# Patient Record
Sex: Female | Born: 1970 | Race: White | Hispanic: No | Marital: Single | Smoking: Never smoker
Health system: Southern US, Community
[De-identification: ages and names within clinical notes are randomized; demographics above are authoritative.]

## PROBLEM LIST (undated history)

## (undated) ENCOUNTER — Emergency Department (HOSPITAL_BASED_OUTPATIENT_CLINIC_OR_DEPARTMENT_OTHER): Payer: Medicaid Other | Source: Home / Self Care

## (undated) DIAGNOSIS — F419 Anxiety disorder, unspecified: Secondary | ICD-10-CM

## (undated) DIAGNOSIS — F329 Major depressive disorder, single episode, unspecified: Secondary | ICD-10-CM

## (undated) DIAGNOSIS — E039 Hypothyroidism, unspecified: Secondary | ICD-10-CM

## (undated) DIAGNOSIS — F32A Depression, unspecified: Secondary | ICD-10-CM

## (undated) DIAGNOSIS — R51 Headache: Secondary | ICD-10-CM

## (undated) DIAGNOSIS — R519 Headache, unspecified: Secondary | ICD-10-CM

## (undated) HISTORY — PX: TUBAL LIGATION: SHX77

---

## 2017-09-30 ENCOUNTER — Emergency Department (HOSPITAL_COMMUNITY)
Admission: EM | Admit: 2017-09-30 | Discharge: 2017-10-01 | Disposition: A | Discharge status: Home / Self Care | Payer: Self-pay | Attending: Emergency Medicine | Admitting: Emergency Medicine

## 2017-09-30 ENCOUNTER — Emergency Department (HOSPITAL_COMMUNITY): Payer: Self-pay

## 2017-09-30 DIAGNOSIS — M5106 Intervertebral disc disorders with myelopathy, lumbar region: Secondary | ICD-10-CM | POA: Insufficient documentation

## 2017-09-30 MED ORDER — HYDROMORPHONE HCL 1 MG/ML IJ SOLN
1.0000 mg | Freq: Once | INTRAMUSCULAR | Status: AC
  Administered 2017-10-01: 1 mg via INTRAVENOUS
  Filled 2017-09-30: qty 1

## 2017-09-30 MED ORDER — DEXAMETHASONE SODIUM PHOSPHATE 10 MG/ML IJ SOLN
10.0000 mg | Freq: Once | INTRAMUSCULAR | Status: AC
  Administered 2017-09-30: 10 mg via INTRAVENOUS
  Filled 2017-09-30: qty 1

## 2017-09-30 MED ORDER — HYDROMORPHONE HCL 1 MG/ML IJ SOLN
1.0000 mg | Freq: Once | INTRAMUSCULAR | Status: AC
  Administered 2017-09-30: 1 mg via INTRAVENOUS
  Filled 2017-09-30: qty 1

## 2017-09-30 MED ORDER — DIAZEPAM 5 MG PO TABS
5.0000 mg | ORAL_TABLET | Freq: Once | ORAL | Status: AC
  Administered 2017-09-30: 5 mg via ORAL
  Filled 2017-09-30: qty 1

## 2017-09-30 NOTE — ED Triage Notes (Signed)
Pt to ER for evaluation of back pain, states onset yesterday, with leg numbness and urinary incontinence. 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 Reports onset yesterday while bending over.

## 2017-09-30 NOTE — ED Notes (Signed)
Pt in MRI.

## 2017-09-30 NOTE — ED Provider Notes (Signed)
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  She reports the pain has been constant and worsening since onset.  She reports that she developed numbness and worsening pain that radiates down the left leg yesterday.  Today she had an episode of urinary incontinence and loose stool that she said she had a hard time "keeping in" denies fevers and chills.  ROS: Urinary incontinence, back pain, left leg numbness and pain  Physical Exam:   Gen: No distress  Neuro: Awake and Alert  Skin: Warm    Focused Exam: Decreased sensation to sharp and light touch to the left lower extremity as compared to the right.  5 out of 5 strength against resistance with dorsiflexion plantarflexion.  Point tenderness to the spinous processes of the lumbar spine.  No bilateral paraspinal muscle tenderness.  No overlying erythema, edema, or warmth.   Initiation of care has begun. The patient has been counseled on the process, plan, and necessity for staying for the completion/evaluation, and the remainder of the medical screening examination    Barkley Boards, PA-C 09/30/17 Daisy Blossom, MD 10/15/17 1046

## 2017-10-01 MED ORDER — PERCOCET 5-325 MG PO TABS: 1.0000 | ORAL_TABLET | ORAL | 0 refills | Status: AC | PRN

## 2017-10-01 MED ORDER — PREDNISONE 50 MG PO TABS: 50.0000 mg | ORAL_TABLET | Freq: Every day | ORAL | 0 refills | Status: DC

## 2017-10-01 MED ORDER — CYCLOBENZAPRINE HCL 10 MG PO TABS: 10.0000 mg | ORAL_TABLET | Freq: Three times a day (TID) | ORAL | 0 refills | Status: DC | PRN

## 2017-10-01 NOTE — Discharge Instructions (Addendum)
Return here as needed.  Follow-up with the neurosurgeon provided. °

## 2017-10-02 ENCOUNTER — Encounter (HOSPITAL_COMMUNITY): Payer: Self-pay | Admitting: Emergency Medicine

## 2017-10-02 ENCOUNTER — Emergency Department (HOSPITAL_COMMUNITY)
Admission: EM | Admit: 2017-10-02 | Discharge: 2017-10-03 | Disposition: A | Discharge status: Home / Self Care | Payer: Self-pay | Attending: Emergency Medicine | Admitting: Emergency Medicine

## 2017-10-02 ENCOUNTER — Other Ambulatory Visit: Payer: Self-pay

## 2017-10-02 DIAGNOSIS — M5417 Radiculopathy, lumbosacral region: Secondary | ICD-10-CM | POA: Insufficient documentation

## 2017-10-02 DIAGNOSIS — Z79899 Other long term (current) drug therapy: Secondary | ICD-10-CM | POA: Insufficient documentation

## 2017-10-02 DIAGNOSIS — M5442 Lumbago with sciatica, left side: Secondary | ICD-10-CM | POA: Insufficient documentation

## 2017-10-02 HISTORY — DX: Major depressive disorder, single episode, unspecified: F32.9

## 2017-10-02 HISTORY — DX: Depression, unspecified: F32.A

## 2017-10-02 LAB — URINALYSIS, ROUTINE W REFLEX MICROSCOPIC
Bilirubin Urine: NEGATIVE
Glucose, UA: NEGATIVE mg/dL
Hgb urine dipstick: NEGATIVE
Ketones, ur: NEGATIVE mg/dL
Leukocytes, UA: NEGATIVE
Nitrite: NEGATIVE
Protein, ur: NEGATIVE mg/dL
Specific Gravity, Urine: 1.042 — ABNORMAL HIGH (ref 1.005–1.030)
pH: 5 (ref 5.0–8.0)

## 2017-10-02 NOTE — ED Notes (Signed)
Wait for treatment room explained. 

## 2017-10-02 NOTE — ED Triage Notes (Signed)
Pt presents with c/o "I cannot walk"; was here a couple days ago for back pain and had MRI done which showed nerves impinged at L5 and S1; pt states she was sent home with pain medication and muscle relaxers and neurosurgeon consult; today patient states she cannot walk, not because she simply cannot but d/t the pain; pt states pain medication not working; cannot care for her children at home and states the appt for neurosurgery is too far out, requesting a neurosurg consult while she is here.

## 2017-10-02 NOTE — ED Notes (Signed)
Updated patient on wait for treatment room.

## 2017-10-02 NOTE — ED Notes (Addendum)
Gave pt urine sample cup in waiting room

## 2017-10-02 NOTE — ED Notes (Signed)
Updated on wait for treatment room. 

## 2017-10-03 MED ORDER — ONDANSETRON HCL 4 MG/2ML IJ SOLN
4.0000 mg | Freq: Once | INTRAMUSCULAR | Status: AC
  Administered 2017-10-03: 4 mg via INTRAVENOUS
  Filled 2017-10-03: qty 2

## 2017-10-03 MED ORDER — HYDROMORPHONE HCL 1 MG/ML IJ SOLN
1.0000 mg | Freq: Once | INTRAMUSCULAR | Status: AC
  Administered 2017-10-03: 1 mg via INTRAVENOUS
  Filled 2017-10-03: qty 1

## 2017-10-03 MED ORDER — METHOCARBAMOL 1000 MG/10ML IJ SOLN
1000.0000 mg | Freq: Once | INTRAVENOUS | Status: AC
  Administered 2017-10-03: 1000 mg via INTRAVENOUS
  Filled 2017-10-03: qty 10

## 2017-10-03 MED ORDER — OXYCODONE HCL 10 MG PO TABS: 10.0000 mg | ORAL_TABLET | ORAL | 0 refills | Status: AC | PRN

## 2017-10-03 MED ORDER — METHOCARBAMOL 500 MG PO TABS: 1000.0000 mg | ORAL_TABLET | Freq: Three times a day (TID) | ORAL | 0 refills | Status: AC | PRN

## 2017-10-03 NOTE — Discharge Instructions (Addendum)
Take the prescription medications as directed. Rest the back as much as possible. Alternate cool and warm compresses for comfort. Follow up with neurosurgery as previously referred or with orthopedics (Dr. Everardo Pacific).

## 2017-10-03 NOTE — ED Provider Notes (Signed)
[facility] EMERGENCY DEPARTMENT Provider Note   CSN: [id number] Arrival date & time: 10/02/17  1744     History   Chief Complaint Chief Complaint  Patient presents with  . Difficulty Walking  . Back Pain    HPI Toni Thomas is a 47 y.o. female.  47 year old female presents with complaint of pain in her back.  Patient states that she developed sudden onset of left lower back pain 3 days ago with pain radiating down her left posterior thigh to her foot.  Patient was seen in the emergency room at that time and told she had a pinched nerve root at L5-S1.  Patient states she has known herniated disks in her lumbar spine at L2-L3 and L4-L5.  Patient has been taking the prednisone, Percocet, Flexeril without any relief of her pain, has tried increasing her Percocet as well is adding 1 Tylenol and 3 ibuprofen without any relief of her pain. Denies abdominal pain, loss of bowel control, groin numbness. Patient states she is able to ambulate however she has severe pain in doing so and cannot wait until her neurosurgery follow up appointment, unable to care for her child in her current condition. Denies history of IVDA, fever. No other complaints or concerns.      Past Medical History:  Diagnosis Date  . Depressed     There are no active problems to display for this patient.   Past Surgical History:  Procedure Laterality Date  . TUBAL LIGATION       OB History   None      Home Medications    Prior to Admission medications   Medication Sig Start Date End Date Taking? Authorizing Provider  acetaminophen (TYLENOL) 325 MG tablet Take 975 mg by mouth 2 (two) times daily.    [provider]  cyclobenzaprine (FLEXERIL) 10 MG tablet Take 1 tablet (10 mg total) by mouth 3 (three) times daily as needed for muscle spasms. 10/01/17   Lawyer, Cristal Deer, PA-C  ibuprofen (ADVIL,MOTRIN) 200 MG tablet Take 800 mg by mouth 2 (two) times daily.    [provider]  PERCOCET 5-325 MG tablet Take 1 tablet by mouth every 4 (four) hours as needed for severe pain. 10/01/17   Lawyer, Cristal Deer, PA-C  predniSONE (DELTASONE) 50 MG tablet Take 1 tablet (50 mg total) by mouth daily with breakfast. 10/01/17   Lawyer, Cristal Deer, PA-C  venlafaxine XR (EFFEXOR-XR) 150 MG 24 hr capsule Take 150 mg by mouth daily with breakfast. Take with 75 mg to equal 225 mg    [provider]  venlafaxine XR (EFFEXOR-XR) 75 MG 24 hr capsule Take 75 mg by mouth daily with breakfast. Take with 150 mg to equal 225 mg    [provider]    Family History History reviewed. No pertinent family history.  Social History Social History   Tobacco Use  . Smoking status: Never Smoker  Substance Use Topics  . Alcohol use: Not Currently  . Drug use: Not Currently     Allergies   Toradol [ketorolac tromethamine]   Review of Systems Review of Systems  Constitutional: Negative for fever.  Gastrointestinal: Negative for abdominal pain, constipation and diarrhea.  Genitourinary: Negative for dysuria and frequency.  Musculoskeletal: Positive for back pain and gait problem.  Skin: Negative for rash and wound.  Allergic/Immunologic: Negative for immunocompromised state.  Neurological: Positive for weakness. Negative for numbness.  All other systems reviewed and are negative.    Physical Exam Updated  Vital Signs BP (!) 149/89 (BP Location: Right Arm)   Pulse 70   Temp 97.9 F (36.6 C)   Resp 16   LMP 09/26/2017   SpO2 100%   Physical Exam  Constitutional: She is oriented to person, place, and time. She appears well-developed and well-nourished. No distress.  HENT:  Head: Normocephalic and atraumatic.  Cardiovascular: Intact distal pulses.  Pulmonary/Chest: Effort normal.  Abdominal: Soft. She exhibits no distension. There is no tenderness.  Musculoskeletal: She exhibits tenderness. She exhibits no deformity.       Lumbar back: She exhibits  tenderness. She exhibits no bony tenderness.       Back:  Neurological: She is alert and oriented to person, place, and time. No sensory deficit. She displays no Babinski's sign on the right side. She displays no Babinski's sign on the left side.  Reflex Scores:      Patellar reflexes are 2+ on the right side and 2+ on the left side. Symmetric dorsiflexion, plantar flexion weak on left compared to right. Reports normal sensation.   Skin: Skin is warm and dry. She is not diaphoretic.  Psychiatric: She has a normal mood and affect. Her behavior is normal.  Nursing note and vitals reviewed.    ED Treatments / Results  Labs (all labs ordered are listed, but only abnormal results are displayed) Labs Reviewed  URINALYSIS, ROUTINE W REFLEX MICROSCOPIC - Abnormal; Notable for the following components:      Result Value   Specific Gravity, Urine 1.042 (*)    All other components within normal limits    EKG None  Radiology No results found.  Procedures Procedures (including critical care time)  Medications Ordered in ED Medications  HYDROmorphone (DILAUDID) injection 1 mg (has no administration in time range)  ondansetron (ZOFRAN) injection 4 mg (has no administration in time range)  methocarbamol (ROBAXIN) 1,000 mg in dextrose 5 % 50 mL IVPB (has no administration in time range)     Initial Impression / Assessment and Plan / ED Course  I have reviewed the triage vital signs and the nursing notes.  Pertinent labs & imaging results that were available during my care of the patient were reviewed by me and considered in my medical decision making (see chart for details).  Clinical Course as of Oct 04 102  Sat Oct 03, 2017  0045 46yo female returns to the ER for ongoing low back pain radiating to the left leg. Patient was seen in the ER 09/30/17 for acute onset pain after bending over, MRI negative for cauda equina. Patient has been taking Prednisone, Percocet, Tylenol, Motrin,  Flexeril without any improvement in her pain. On exam, left SI tenderness, reflexes symmetric, slight weakness with plantar flexion left foot. Discussed with Dr. Preston Fleeting, Er attending, recommends Robaxin Iv, will also given Dilaudid and Zofran. Goal is to reduce pain to allow for dc and better control with PO pain medications at home. If patient has relief of pain with robaxin, will give rx for same and dc Flexeril.    [LM]    Clinical Course User Index [LM] Jeannie Fend, PA-C    Final Clinical Impressions(s) / ED Diagnoses   Final diagnoses:  Lumbosacral radiculopathy    ED Discharge Orders    None       Jeannie Fend, PA-C 10/03/17 0105    Dione Booze, MD 10/03/17 [phone]

## 2017-10-03 NOTE — ED Provider Notes (Signed)
4 days ago here with back pain after bending over MRI showed 'pinched nerve" L5-S1 Here with uncontrolled pain  Requesting neurosurg tonight - explained there won't be consult in ED tonight Pending IV Robaxin, Dilaudid and Zofran  Plan: discharge home to keep scheduled outpatient appointment with neurosurg  Recheck - patient is tearful. States pain is not appreciably better. Discussed additional dose of dilaudid here and changing home regimen. Will also provide ortho referral to expedite being seen for further outpatient treatment.    Elpidio Anis, PA-C 10/03/17 0256    Dione Booze, MD 10/03/17 [phone]

## 2017-10-05 ENCOUNTER — Other Ambulatory Visit: Payer: Self-pay | Admitting: Neurosurgery

## 2017-10-08 ENCOUNTER — Other Ambulatory Visit: Payer: Self-pay

## 2017-10-08 ENCOUNTER — Encounter (HOSPITAL_COMMUNITY): Payer: Self-pay | Admitting: *Deleted

## 2017-10-08 NOTE — Progress Notes (Signed)
Spoke with pt for pre-op call. Pt denies cardiac history, HTN or diabetes. Pt states she does have a productive cough of thick, yellow sputum. I could hear her cough and it sounded very congested. I strongly encouraged her to see her PCP or go to an Urgent Care to be checked out. She denies any fever or any other symptoms. States she's had the cough for about 4 days.

## 2017-10-09 ENCOUNTER — Other Ambulatory Visit: Payer: Self-pay

## 2017-10-09 ENCOUNTER — Encounter (HOSPITAL_COMMUNITY)
Admission: RE | Disposition: A | Discharge status: Home / Self Care | Payer: Self-pay | Source: Ambulatory Visit | Attending: Neurosurgery

## 2017-10-09 ENCOUNTER — Ambulatory Visit (HOSPITAL_COMMUNITY): Payer: Self-pay

## 2017-10-09 ENCOUNTER — Ambulatory Visit (HOSPITAL_COMMUNITY): Payer: Self-pay | Admitting: Anesthesiology

## 2017-10-09 ENCOUNTER — Encounter (HOSPITAL_COMMUNITY): Payer: Self-pay | Admitting: Certified Registered Nurse Anesthetist

## 2017-10-09 ENCOUNTER — Ambulatory Visit (HOSPITAL_COMMUNITY)
Admission: RE | Admit: 2017-10-09 | Discharge: 2017-10-10 | Disposition: A | Discharge status: Home / Self Care | Payer: Self-pay | Source: Ambulatory Visit | Attending: Neurosurgery | Admitting: Neurosurgery

## 2017-10-09 DIAGNOSIS — Z419 Encounter for procedure for purposes other than remedying health state, unspecified: Secondary | ICD-10-CM

## 2017-10-09 DIAGNOSIS — Z6837 Body mass index (BMI) 37.0-37.9, adult: Secondary | ICD-10-CM | POA: Insufficient documentation

## 2017-10-09 DIAGNOSIS — M5126 Other intervertebral disc displacement, lumbar region: Secondary | ICD-10-CM | POA: Insufficient documentation

## 2017-10-09 DIAGNOSIS — E039 Hypothyroidism, unspecified: Secondary | ICD-10-CM | POA: Insufficient documentation

## 2017-10-09 DIAGNOSIS — E669 Obesity, unspecified: Secondary | ICD-10-CM | POA: Insufficient documentation

## 2017-10-09 HISTORY — DX: Anxiety disorder, unspecified: F41.9

## 2017-10-09 HISTORY — DX: Hypothyroidism, unspecified: E03.9

## 2017-10-09 HISTORY — DX: Headache: R51

## 2017-10-09 HISTORY — DX: Headache, unspecified: R51.9

## 2017-10-09 HISTORY — PX: LUMBAR LAMINECTOMY/DECOMPRESSION MICRODISCECTOMY: SHX5026

## 2017-10-09 LAB — CBC
HEMATOCRIT: 40.7 % (ref 36.0–46.0)
Hemoglobin: 12.5 g/dL (ref 12.0–15.0)
MCH: 26.8 pg (ref 26.0–34.0)
MCHC: 30.7 g/dL (ref 30.0–36.0)
MCV: 87.3 fL (ref 78.0–100.0)
PLATELETS: 336 10*3/uL (ref 150–400)
RBC: 4.66 MIL/uL (ref 3.87–5.11)
RDW: 16.6 % — ABNORMAL HIGH (ref 11.5–15.5)
WBC: 7.3 10*3/uL (ref 4.0–10.5)

## 2017-10-09 SURGERY — LUMBAR LAMINECTOMY/DECOMPRESSION MICRODISCECTOMY 1 LEVEL
Anesthesia: General | Laterality: Left

## 2017-10-09 MED ORDER — LIDOCAINE 2% (20 MG/ML) 5 ML SYRINGE
INTRAMUSCULAR | Status: DC | PRN
  Administered 2017-10-09: 40 mg via INTRAVENOUS

## 2017-10-09 MED ORDER — PROPOFOL 10 MG/ML IV BOLUS
INTRAVENOUS | Status: AC
  Filled 2017-10-09: qty 20

## 2017-10-09 MED ORDER — LIDOCAINE-EPINEPHRINE 0.5 %-1:200000 IJ SOLN
INTRAMUSCULAR | Status: DC | PRN
  Administered 2017-10-09: 10 mL

## 2017-10-09 MED ORDER — VENLAFAXINE HCL ER 75 MG PO CP24
225.0000 mg | ORAL_CAPSULE | Freq: Every day | ORAL | Status: DC
  Administered 2017-10-10: 225 mg via ORAL
  Filled 2017-10-09: qty 3

## 2017-10-09 MED ORDER — CELECOXIB 200 MG PO CAPS
200.0000 mg | ORAL_CAPSULE | Freq: Two times a day (BID) | ORAL | Status: DC
  Administered 2017-10-09 – 2017-10-10 (×2): 200 mg via ORAL
  Filled 2017-10-09 (×2): qty 1

## 2017-10-09 MED ORDER — OXYCODONE HCL 5 MG PO TABS
ORAL_TABLET | ORAL | Status: AC
  Filled 2017-10-09: qty 2

## 2017-10-09 MED ORDER — OXYCODONE HCL 5 MG PO TABS
10.0000 mg | ORAL_TABLET | ORAL | Status: DC | PRN
  Administered 2017-10-09 – 2017-10-10 (×3): 10 mg via ORAL
  Filled 2017-10-09 (×2): qty 2

## 2017-10-09 MED ORDER — OXYCODONE HCL 5 MG PO TABS
5.0000 mg | ORAL_TABLET | ORAL | Status: DC | PRN
  Administered 2017-10-09: 5 mg via ORAL
  Filled 2017-10-09: qty 1

## 2017-10-09 MED ORDER — VENLAFAXINE HCL ER 75 MG PO CP24: 150.0000 mg | ORAL_CAPSULE | Freq: Every day | ORAL | Status: DC

## 2017-10-09 MED ORDER — SODIUM CHLORIDE 0.9% FLUSH
3.0000 mL | Freq: Two times a day (BID) | INTRAVENOUS | Status: DC
  Administered 2017-10-09: 3 mL via INTRAVENOUS

## 2017-10-09 MED ORDER — ROCURONIUM BROMIDE 50 MG/5ML IV SOSY
PREFILLED_SYRINGE | INTRAVENOUS | Status: AC
  Filled 2017-10-09: qty 10

## 2017-10-09 MED ORDER — GABAPENTIN 300 MG PO CAPS
300.0000 mg | ORAL_CAPSULE | Freq: Three times a day (TID) | ORAL | Status: DC
  Administered 2017-10-09 – 2017-10-10 (×2): 300 mg via ORAL
  Filled 2017-10-09 (×2): qty 1

## 2017-10-09 MED ORDER — DEXAMETHASONE SODIUM PHOSPHATE 10 MG/ML IJ SOLN
INTRAMUSCULAR | Status: DC | PRN
  Administered 2017-10-09: 10 mg via INTRAVENOUS

## 2017-10-09 MED ORDER — THROMBIN 5000 UNITS EX SOLR
CUTANEOUS | Status: AC
  Filled 2017-10-09: qty 10000

## 2017-10-09 MED ORDER — ONDANSETRON HCL 4 MG PO TABS: 4.0000 mg | ORAL_TABLET | Freq: Four times a day (QID) | ORAL | Status: DC | PRN

## 2017-10-09 MED ORDER — ACETAMINOPHEN 325 MG PO TABS: 650.0000 mg | ORAL_TABLET | ORAL | Status: DC | PRN

## 2017-10-09 MED ORDER — LACTATED RINGERS IV SOLN
INTRAVENOUS | Status: DC
  Administered 2017-10-09 (×2): via INTRAVENOUS

## 2017-10-09 MED ORDER — OXYCODONE HCL ER 10 MG PO T12A
10.0000 mg | EXTENDED_RELEASE_TABLET | Freq: Two times a day (BID) | ORAL | Status: DC
  Administered 2017-10-09 – 2017-10-10 (×2): 10 mg via ORAL
  Filled 2017-10-09 (×2): qty 1

## 2017-10-09 MED ORDER — PHENYLEPHRINE 40 MCG/ML (10ML) SYRINGE FOR IV PUSH (FOR BLOOD PRESSURE SUPPORT)
PREFILLED_SYRINGE | INTRAVENOUS | Status: AC
  Filled 2017-10-09: qty 20

## 2017-10-09 MED ORDER — CHLORHEXIDINE GLUCONATE CLOTH 2 % EX PADS: 6.0000 | MEDICATED_PAD | Freq: Once | CUTANEOUS | Status: DC

## 2017-10-09 MED ORDER — HYDROMORPHONE HCL 1 MG/ML IJ SOLN: 1.0000 mg | INTRAMUSCULAR | Status: DC | PRN

## 2017-10-09 MED ORDER — PROMETHAZINE HCL 25 MG/ML IJ SOLN: 6.2500 mg | INTRAMUSCULAR | Status: DC | PRN

## 2017-10-09 MED ORDER — ACETAMINOPHEN 500 MG PO TABS
1000.0000 mg | ORAL_TABLET | Freq: Four times a day (QID) | ORAL | Status: DC
  Administered 2017-10-09 – 2017-10-10 (×3): 1000 mg via ORAL
  Filled 2017-10-09 (×3): qty 2

## 2017-10-09 MED ORDER — POTASSIUM CHLORIDE IN NACL 20-0.9 MEQ/L-% IV SOLN: INTRAVENOUS | Status: DC

## 2017-10-09 MED ORDER — FENTANYL CITRATE (PF) 100 MCG/2ML IJ SOLN
INTRAMUSCULAR | Status: AC
  Filled 2017-10-09: qty 2

## 2017-10-09 MED ORDER — ONDANSETRON HCL 4 MG/2ML IJ SOLN: 4.0000 mg | Freq: Four times a day (QID) | INTRAMUSCULAR | Status: DC | PRN

## 2017-10-09 MED ORDER — DIAZEPAM 5 MG PO TABS
5.0000 mg | ORAL_TABLET | Freq: Four times a day (QID) | ORAL | Status: DC | PRN
  Administered 2017-10-09: 5 mg via ORAL
  Filled 2017-10-09: qty 1

## 2017-10-09 MED ORDER — OXYCODONE HCL 5 MG/5ML PO SOLN: 5.0000 mg | Freq: Once | ORAL | Status: DC | PRN

## 2017-10-09 MED ORDER — DEXAMETHASONE SODIUM PHOSPHATE 10 MG/ML IJ SOLN
INTRAMUSCULAR | Status: AC
  Filled 2017-10-09: qty 2

## 2017-10-09 MED ORDER — CEFAZOLIN SODIUM-DEXTROSE 2-4 GM/100ML-% IV SOLN
INTRAVENOUS | Status: AC
  Filled 2017-10-09: qty 100

## 2017-10-09 MED ORDER — CEFAZOLIN SODIUM-DEXTROSE 2-4 GM/100ML-% IV SOLN
2.0000 g | INTRAVENOUS | Status: AC
  Administered 2017-10-09: 2 g via INTRAVENOUS

## 2017-10-09 MED ORDER — OXYCODONE HCL 5 MG PO TABS: 5.0000 mg | ORAL_TABLET | Freq: Once | ORAL | Status: DC | PRN

## 2017-10-09 MED ORDER — THROMBIN 5000 UNITS EX SOLR
CUTANEOUS | Status: DC | PRN
  Administered 2017-10-09: 10000 [IU] via TOPICAL

## 2017-10-09 MED ORDER — FENTANYL CITRATE (PF) 100 MCG/2ML IJ SOLN
25.0000 ug | INTRAMUSCULAR | Status: DC | PRN
  Administered 2017-10-09: 25 ug via INTRAVENOUS
  Administered 2017-10-09: 50 ug via INTRAVENOUS

## 2017-10-09 MED ORDER — VENLAFAXINE HCL ER 75 MG PO CP24: 75.0000 mg | ORAL_CAPSULE | Freq: Every day | ORAL | Status: DC

## 2017-10-09 MED ORDER — ROCURONIUM BROMIDE 50 MG/5ML IV SOSY
PREFILLED_SYRINGE | INTRAVENOUS | Status: AC
  Filled 2017-10-09: qty 5

## 2017-10-09 MED ORDER — ONDANSETRON HCL 4 MG/2ML IJ SOLN
INTRAMUSCULAR | Status: AC
  Filled 2017-10-09: qty 6

## 2017-10-09 MED ORDER — FENTANYL CITRATE (PF) 100 MCG/2ML IJ SOLN
50.0000 ug | Freq: Once | INTRAMUSCULAR | Status: AC
  Administered 2017-10-09: 50 ug via INTRAVENOUS

## 2017-10-09 MED ORDER — ACETAMINOPHEN 650 MG RE SUPP: 650.0000 mg | RECTAL | Status: DC | PRN

## 2017-10-09 MED ORDER — DOCUSATE SODIUM 100 MG PO CAPS
100.0000 mg | ORAL_CAPSULE | Freq: Two times a day (BID) | ORAL | Status: DC
  Administered 2017-10-09 – 2017-10-10 (×2): 100 mg via ORAL
  Filled 2017-10-09 (×2): qty 1

## 2017-10-09 MED ORDER — ONDANSETRON HCL 4 MG/2ML IJ SOLN
INTRAMUSCULAR | Status: AC
  Filled 2017-10-09: qty 2

## 2017-10-09 MED ORDER — ROCURONIUM BROMIDE 10 MG/ML (PF) SYRINGE
PREFILLED_SYRINGE | INTRAVENOUS | Status: DC | PRN
  Administered 2017-10-09: 20 mg via INTRAVENOUS
  Administered 2017-10-09: 50 mg via INTRAVENOUS

## 2017-10-09 MED ORDER — FENTANYL CITRATE (PF) 100 MCG/2ML IJ SOLN
INTRAMUSCULAR | Status: DC | PRN
  Administered 2017-10-09 (×3): 50 ug via INTRAVENOUS

## 2017-10-09 MED ORDER — HEMOSTATIC AGENTS (NO CHARGE) OPTIME
TOPICAL | Status: DC | PRN
  Administered 2017-10-09: 1 via TOPICAL

## 2017-10-09 MED ORDER — PHENOL 1.4 % MT LIQD: 1.0000 | OROMUCOSAL | Status: DC | PRN

## 2017-10-09 MED ORDER — SODIUM CHLORIDE 0.9 % IV SOLN
INTRAVENOUS | Status: DC | PRN
  Administered 2017-10-09: 25 ug/min via INTRAVENOUS

## 2017-10-09 MED ORDER — LIDOCAINE-EPINEPHRINE 0.5 %-1:200000 IJ SOLN
INTRAMUSCULAR | Status: AC
  Filled 2017-10-09: qty 1

## 2017-10-09 MED ORDER — SODIUM CHLORIDE 0.9% FLUSH: 3.0000 mL | INTRAVENOUS | Status: DC | PRN

## 2017-10-09 MED ORDER — SUGAMMADEX SODIUM 200 MG/2ML IV SOLN
INTRAVENOUS | Status: DC | PRN
  Administered 2017-10-09: 200 mg via INTRAVENOUS

## 2017-10-09 MED ORDER — LIDOCAINE 2% (20 MG/ML) 5 ML SYRINGE
INTRAMUSCULAR | Status: AC
  Filled 2017-10-09: qty 5

## 2017-10-09 MED ORDER — DEXAMETHASONE SODIUM PHOSPHATE 10 MG/ML IJ SOLN
INTRAMUSCULAR | Status: AC
  Filled 2017-10-09: qty 1

## 2017-10-09 MED ORDER — MIDAZOLAM HCL 2 MG/2ML IJ SOLN
INTRAMUSCULAR | Status: DC | PRN
  Administered 2017-10-09: 2 mg via INTRAVENOUS

## 2017-10-09 MED ORDER — ZOLPIDEM TARTRATE 5 MG PO TABS: 5.0000 mg | ORAL_TABLET | Freq: Every evening | ORAL | Status: DC | PRN

## 2017-10-09 MED ORDER — ARTIFICIAL TEARS OPHTHALMIC OINT
TOPICAL_OINTMENT | OPHTHALMIC | Status: AC
  Filled 2017-10-09: qty 7

## 2017-10-09 MED ORDER — PHENYLEPHRINE 40 MCG/ML (10ML) SYRINGE FOR IV PUSH (FOR BLOOD PRESSURE SUPPORT)
PREFILLED_SYRINGE | INTRAVENOUS | Status: DC | PRN
  Administered 2017-10-09: 120 ug via INTRAVENOUS

## 2017-10-09 MED ORDER — 0.9 % SODIUM CHLORIDE (POUR BTL) OPTIME
TOPICAL | Status: DC | PRN
  Administered 2017-10-09: 1000 mL

## 2017-10-09 MED ORDER — MENTHOL 3 MG MT LOZG: 1.0000 | LOZENGE | OROMUCOSAL | Status: DC | PRN

## 2017-10-09 MED ORDER — ONDANSETRON HCL 4 MG/2ML IJ SOLN
INTRAMUSCULAR | Status: DC | PRN
  Administered 2017-10-09: 4 mg via INTRAVENOUS

## 2017-10-09 MED ORDER — PROPOFOL 10 MG/ML IV BOLUS
INTRAVENOUS | Status: DC | PRN
  Administered 2017-10-09: 200 mg via INTRAVENOUS

## 2017-10-09 MED ORDER — PROPOFOL 1000 MG/100ML IV EMUL
INTRAVENOUS | Status: AC
  Filled 2017-10-09: qty 100

## 2017-10-09 MED ORDER — SODIUM CHLORIDE 0.9 % IV SOLN: 250.0000 mL | INTRAVENOUS | Status: DC

## 2017-10-09 SURGICAL SUPPLY — 46 items
BENZOIN TINCTURE PRP APPL 2/3 (GAUZE/BANDAGES/DRESSINGS) IMPLANT
BLADE CLIPPER SURG (BLADE) IMPLANT
BUR MATCHSTICK NEURO 3.0 LAGG (BURR) ×2 IMPLANT
BUR PRECISION FLUTE 5.0 (BURR) ×2 IMPLANT
CANISTER SUCT 3000ML PPV (MISCELLANEOUS) ×2 IMPLANT
CARTRIDGE OIL MAESTRO DRILL (MISCELLANEOUS) ×1 IMPLANT
DECANTER SPIKE VIAL GLASS SM (MISCELLANEOUS) ×2 IMPLANT
DERMABOND ADVANCED (GAUZE/BANDAGES/DRESSINGS) ×1
DERMABOND ADVANCED .7 DNX12 (GAUZE/BANDAGES/DRESSINGS) ×1 IMPLANT
DIFFUSER DRILL AIR PNEUMATIC (MISCELLANEOUS) ×2 IMPLANT
DRAPE LAPAROTOMY 100X72X124 (DRAPES) ×2 IMPLANT
DRAPE MICROSCOPE LEICA (MISCELLANEOUS) ×2 IMPLANT
DRAPE SURG 17X23 STRL (DRAPES) ×2 IMPLANT
DURAPREP 26ML APPLICATOR (WOUND CARE) ×2 IMPLANT
ELECT REM PT RETURN 9FT ADLT (ELECTROSURGICAL) ×2
ELECTRODE REM PT RTRN 9FT ADLT (ELECTROSURGICAL) ×1 IMPLANT
GAUZE 4X4 16PLY RFD (DISPOSABLE) IMPLANT
GAUZE SPONGE 4X4 12PLY STRL (GAUZE/BANDAGES/DRESSINGS) IMPLANT
GLOVE ECLIPSE 6.5 STRL STRAW (GLOVE) ×2 IMPLANT
GLOVE EXAM NITRILE LRG STRL (GLOVE) IMPLANT
GLOVE EXAM NITRILE XL STR (GLOVE) IMPLANT
GLOVE EXAM NITRILE XS STR PU (GLOVE) IMPLANT
GOWN STRL REUS W/ TWL LRG LVL3 (GOWN DISPOSABLE) ×2 IMPLANT
GOWN STRL REUS W/ TWL XL LVL3 (GOWN DISPOSABLE) IMPLANT
GOWN STRL REUS W/TWL 2XL LVL3 (GOWN DISPOSABLE) IMPLANT
GOWN STRL REUS W/TWL LRG LVL3 (GOWN DISPOSABLE) ×2
GOWN STRL REUS W/TWL XL LVL3 (GOWN DISPOSABLE)
KIT BASIN OR (CUSTOM PROCEDURE TRAY) ×2 IMPLANT
KIT TURNOVER KIT B (KITS) ×2 IMPLANT
NEEDLE HYPO 25X1 1.5 SAFETY (NEEDLE) ×2 IMPLANT
NEEDLE SPNL 18GX3.5 QUINCKE PK (NEEDLE) ×2 IMPLANT
NS IRRIG 1000ML POUR BTL (IV SOLUTION) ×2 IMPLANT
OIL CARTRIDGE MAESTRO DRILL (MISCELLANEOUS) ×2
PACK LAMINECTOMY NEURO (CUSTOM PROCEDURE TRAY) ×2 IMPLANT
PAD ARMBOARD 7.5X6 YLW CONV (MISCELLANEOUS) ×6 IMPLANT
RUBBERBAND STERILE (MISCELLANEOUS) ×4 IMPLANT
SPONGE LAP 4X18 RFD (DISPOSABLE) IMPLANT
SPONGE SURGIFOAM ABS GEL SZ50 (HEMOSTASIS) ×2 IMPLANT
STRIP CLOSURE SKIN 1/2X4 (GAUZE/BANDAGES/DRESSINGS) IMPLANT
SUT VIC AB 0 CT1 18XCR BRD8 (SUTURE) ×1 IMPLANT
SUT VIC AB 0 CT1 8-18 (SUTURE) ×1
SUT VIC AB 2-0 CT1 18 (SUTURE) ×2 IMPLANT
SUT VIC AB 3-0 SH 8-18 (SUTURE) ×4 IMPLANT
TOWEL GREEN STERILE (TOWEL DISPOSABLE) ×2 IMPLANT
TOWEL GREEN STERILE FF (TOWEL DISPOSABLE) ×2 IMPLANT
WATER STERILE IRR 1000ML POUR (IV SOLUTION) ×2 IMPLANT

## 2017-10-09 NOTE — Anesthesia Procedure Notes (Signed)
Procedure Name: Intubation Date/Time: 10/09/2017 3:42 PM Performed by: Barrington Ellison, CRNA Pre-anesthesia Checklist: Patient identified, Emergency Drugs available, Suction available and Patient being monitored Patient Re-evaluated:Patient Re-evaluated prior to induction Oxygen Delivery Method: Circle System Utilized Preoxygenation: Pre-oxygenation with 100% oxygen Induction Type: IV induction Ventilation: Mask ventilation without difficulty Laryngoscope Size: Mac and 3 Grade View: Grade I Tube type: Oral Tube size: 7.0 mm Number of attempts: 1 Airway Equipment and Method: Stylet and Oral airway Placement Confirmation: ETT inserted through vocal cords under direct vision,  positive ETCO2 and breath sounds checked- equal and bilateral Secured at: 21 cm Tube secured with: Tape Dental Injury: Teeth and Oropharynx as per pre-operative assessment

## 2017-10-09 NOTE — H&P (Signed)
BP 118/71   Pulse 98   Temp 98.7 F (37.1 C) (Oral)   Resp 20   Ht 5\' 8"  (1.727 m)   Wt 111.1 kg   LMP 09/21/2017   SpO2 100%   BMI 37.25 kg/m  Toni Thomas presents with severe pain in the left lower extremity. MRI shows a large disc herniation at L4/5 eccentric to the left.  Allergies  Allergen Reactions  . Toradol [Ketorolac Tromethamine] Shortness Of Breath and Nausea And Vomiting    Chest Pain   Past Medical History:  Diagnosis Date  . Anxiety   . Depressed   . Headache   . Hypothyroidism    not on medication   Past Surgical History:  Procedure Laterality Date  . TUBAL LIGATION     Family History  Problem Relation Age of Onset  . Atrial fibrillation Mother    Social History   Socioeconomic History  . Marital status: Single    Spouse name: Not on file  . Number of children: Not on file  . Years of education: Not on file  . Highest education level: Not on file  Occupational History  . Not on file  Social Needs  . Financial resource strain: Not on file  . Food insecurity:    Worry: Not on file    Inability: Not on file  . Transportation needs:    Medical: Not on file    Non-medical: Not on file  Tobacco Use  . Smoking status: Never Smoker  . Smokeless tobacco: Never Used  Substance and Sexual Activity  . Alcohol use: Not Currently  . Drug use: Not Currently  . Sexual activity: Not on file  Lifestyle  . Physical activity:    Days per week: Not on file    Minutes per session: Not on file  . Stress: Not on file  Relationships  . Social connections:    Talks on phone: Not on file    Gets together: Not on file    Attends religious service: Not on file    Active member of club or organization: Not on file    Attends meetings of clubs or organizations: Not on file    Relationship status: Not on file  . Intimate partner violence:    Fear of current or ex partner: Not on file    Emotionally abused: Not on file    Physically abused: Not on file   Forced sexual activity: Not on file  Other Topics Concern  . Not on file  Social History Narrative  . Not on file   Physical Exam  Constitutional: She is oriented to person, place, and time. She appears well-developed and well-nourished. She appears distressed.  HENT:  Head: Normocephalic and atraumatic.  Eyes: Pupils are equal, round, and reactive to light. Conjunctivae and EOM are normal.  Neck: Normal range of motion. Neck supple.  Cardiovascular: Normal rate and regular rhythm.  Pulmonary/Chest: Effort normal and breath sounds normal.  Musculoskeletal: Normal range of motion.  Neurological: She is alert and oriented to person, place, and time. No cranial nerve deficit or sensory deficit. She exhibits normal muscle tone. Coordination normal.  Skin: Skin is warm and dry.  Psychiatric: She has a normal mood and affect. Her behavior is normal. Judgment and thought content normal.  BP 118/71   Pulse 98   Temp 98.7 F (37.1 C) (Oral)   Resp 20   Ht 5\' 8"  (1.727 m)   Wt 111.1 kg   LMP 09/21/2017  SpO2 100%   BMI 37.25 kg/m  Toni Thomas has decided to undergo a lumbar discetomy/decompression for a herniated disc at L4/5. Risks and benefits including but not limited to bleeding, infection, paralysis, weakness in one or both extremities, bowel and/or bladder dysfunction, need for further surgery, no relief of pain. [facility] understands and wishes to proceed.

## 2017-10-09 NOTE — Anesthesia Preprocedure Evaluation (Addendum)
Anesthesia Evaluation  Patient identified by MRN, date of birth, ID band Patient awake    Reviewed: Allergy & Precautions, NPO status , Patient's Chart, lab work & pertinent test results  History of Anesthesia Complications Negative for: history of anesthetic complications  Airway Mallampati: II  TM Distance: >3 FB Neck ROM: Full    Dental  (+) Dental Advisory Given, Poor Dentition, Loose,    Pulmonary neg pulmonary ROS,    breath sounds clear to auscultation       Cardiovascular negative cardio ROS   Rhythm:Regular Rate:Normal     Neuro/Psych  Headaches, PSYCHIATRIC DISORDERS Anxiety Depression  Herniated lumbar disc   Neuromuscular disease (left sided lumbar radicular pain)    GI/Hepatic negative GI ROS, Neg liver ROS,   Endo/Other  Hypothyroidism (no meds)  Obesity   Renal/GU negative Renal ROS  negative genitourinary   Musculoskeletal negative musculoskeletal ROS (+)   Abdominal (+) + obese,   Peds  Hematology negative hematology ROS (+)   Anesthesia Other Findings   Reproductive/Obstetrics  S/p tubal ligation                            Anesthesia Physical Anesthesia Plan  ASA: II  Anesthesia Plan: General   Post-op Pain Management:    Induction: Intravenous  PONV Risk Score and Plan: 4 or greater and Treatment may vary due to age or medical condition, Ondansetron, Midazolam, Dexamethasone and Scopolamine patch - Pre-op  Airway Management Planned: Oral ETT  Additional Equipment: None  Intra-op Plan:   Post-operative Plan: Extubation in OR  Informed Consent: I have reviewed the patients History and Physical, chart, labs and discussed the procedure including the risks, benefits and alternatives for the proposed anesthesia with the patient or authorized representative who has indicated his/her understanding and acceptance.   Dental advisory given  Plan Discussed  with: CRNA and Anesthesiologist  Anesthesia Plan Comments:        Anesthesia Quick Evaluation

## 2017-10-09 NOTE — Op Note (Signed)
10/09/2017  5:25 PM  PATIENT:  Toni Thomas  47 y.o. female with a herniated disc eccentric to the left at L4/5  PRE-OPERATIVE DIAGNOSIS:  Lumbar herniated disc L4/5 left POST-OPERATIVE DIAGNOSIS:  Lumbar herniated disc L4/5 Left PROCEDURE:  Procedure(s): Left Lumbar Four-Five Microdiscectomy  SURGEON:   Surgeon(s): Coletta Memosabbell, Darric Plante, MD  ASSISTANTS:none  ANESTHESIA:   general  EBL:  Total I/O In: 1000 [I.V.:1000] Out: 20 [Blood:20]  BLOOD ADMINISTERED:none    COUNT:per nursing  DRAINS: none   SPECIMEN:  No Specimen  DICTATION: [name] was taken to the operating room, intubated and placed under a general anesthetic without difficulty. She was positioned prone on a Wilson frame with all pressure points padded. Her back was prepped and draped in a sterile manner. I opened the skin with a 10 blade and carried the dissection down to the thoracolumbar fascia. I used both sharp dissection and the monopolar cautery to expose the lamina of L4, and 5. I confirmed my location with an intraoperative xray.  I used the drill, Kerrison punches, and curettes to perform a semihemilaminectomy of L4 and 5. I used the punches to remove the ligamentum flavum to expose the thecal sac. I brought the microscope into the operative field and started the decompression of the spinal canal, thecal sac and L5 root(s). I cauterized epidural veins overlying the disc space then divided them sharply. I opened the disc space with a 15 blade and proceeded with the discectomy. I used pituitary rongeurs, curettes, and other instruments to remove disc material. After the discectomy was completed I inspected the L5 nerve root and felt it was well decompressed. I explored rostrally, laterally, medially, and caudally and was satisfied with the decompression. I irrigated the wound, then closed in layers. I approximated the thoracolumbar fascia, subcutaneous, and subcuticular planes with vicryl sutures. I used  dermabond for a sterile dressing.   PLAN OF CARE: Admit for overnight observation  PATIENT DISPOSITION:  PACU - hemodynamically stable.   Delay start of Pharmacological VTE agent (>24hrs) due to surgical blood loss or risk of bleeding:  yes

## 2017-10-09 NOTE — Transfer of Care (Signed)
Immediate Anesthesia Transfer of Care Note  Patient: Toni Thomas  Procedure(s) Performed: Left Lumbar Four-Five Microdiscectomy (Left )  Patient Location: PACU  Anesthesia Type:General  Level of Consciousness: awake, alert  and oriented  Airway & Oxygen Therapy: Patient Spontanous Breathing  Post-op Assessment: Report given to RN and Patient moving all extremities X 4  Post vital signs: Reviewed and stable  Last Vitals:  Vitals Value Taken Time  BP 90/48 10/09/2017  5:27 PM  Temp    Pulse 110 10/09/2017  5:28 PM  Resp 12 10/09/2017  5:27 PM  SpO2 92 % 10/09/2017  5:28 PM  Vitals shown include unvalidated device data.  Last Pain:  Vitals:   10/09/17 1231  TempSrc: Oral         Complications: No apparent anesthesia complications

## 2017-10-10 MED ORDER — TIZANIDINE HCL 4 MG PO TABS: 4.0000 mg | ORAL_TABLET | Freq: Four times a day (QID) | ORAL | 0 refills | Status: DC | PRN

## 2017-10-10 MED ORDER — OXYCODONE HCL 5 MG PO TABS: 5.0000 mg | ORAL_TABLET | Freq: Four times a day (QID) | ORAL | 0 refills | Status: AC | PRN

## 2017-10-10 NOTE — Discharge Instructions (Signed)
Wound Care °Leave incision open to air. °You may shower. °Do not scrub directly on incision.  °Do not put any creams, lotions, or ointments on incision. °Activity °Walk each and every day, increasing distance each day. °No lifting greater than 5 lbs.  Avoid bending, arching, and twisting. °No driving for 2 weeks; may ride as a passenger locally. ° °Diet °Resume your normal diet.  °Return to Work °Will be discussed at you follow up appointment. °Call Your Doctor If Any of These Occur °Redness, drainage, or swelling at the wound.  °Temperature greater than 101 degrees. °Severe pain not relieved by pain medication. °Incision starts to come apart. °Follow Up Appt °Call today for appointment in 3 weeks ([phone]) or for problems.  If you have any hardware placed in your spine, you will need an x-ray before your appointment. °

## 2017-10-10 NOTE — Discharge Summary (Signed)
  Physician Discharge Summary  Patient ID: Toni Thomas MRN: [id number] DOB/AGE: [birth year] 47 y.o.  Admit date: 10/09/2017 Discharge date: 10/10/2017  Admission Diagnoses:HNP left L4/5  Discharge Diagnoses: sAme Active Problems:   HNP (herniated nucleus pulposus), lumbar   Discharged Condition: good  Hospital Course: Toni Thomas presented with severe pain in the left lower extremity due to a herniated disc at L4/5 on the left. She underwent a uncomplicated lumbar discetomy at L4/5 on the left. Post op her pain is improved. She is voiding, ambulating, and tolerating a regular diet. Her wound is clean, dry, and without signs of infection.  Treatments: surgery: Left L4/5 semihemilaminectomy and discetomy  Discharge Exam: Blood pressure 104/68, pulse 92, temperature 98.1 F (36.7 C), temperature source Oral, resp. rate 16, height 5\' 8"  (1.727 m), weight 111.1 kg, last menstrual period 09/21/2017, SpO2 97 %. General appearance: alert, cooperative, appears stated age and no distress Neurologic: Alert and oriented X 3, normal strength and tone. Normal symmetric reflexes. Normal coordination and gait  Disposition: Discharge disposition: 01-Home or Self Care      Lumbar herniated disc Discharge Instructions    Discharge instructions   Complete by:  As directed      Allergies as of 10/10/2017      Reactions   Toradol [ketorolac Tromethamine] Shortness Of Breath, Nausea And Vomiting   Chest Pain      Medication List    TAKE these medications   acetaminophen 325 MG tablet Commonly known as:  TYLENOL Take 325-975 mg by mouth every 4 (four) hours as needed for moderate pain.   ibuprofen 200 MG tablet Commonly known as:  ADVIL,MOTRIN Take 800 mg by mouth every 8 (eight) hours as needed for moderate pain.   methocarbamol 500 MG tablet Commonly known as:  ROBAXIN Take 2 tablets (1,000 mg total) by mouth every 8 (eight) hours as needed for up to 7 days for muscle  spasms.   Oxycodone HCl 10 MG Tabs Take 1 tablet (10 mg total) by mouth every 4 (four) hours as needed for severe pain.   oxyCODONE 5 MG immediate release tablet Commonly known as:  Oxy IR/ROXICODONE Take 1 tablet (5 mg total) by mouth every 6 (six) hours as needed for severe pain.   PERCOCET 5-325 MG tablet Generic drug:  oxyCODONE-acetaminophen Take 1 tablet by mouth every 4 (four) hours as needed for severe pain.   predniSONE 50 MG tablet Commonly known as:  DELTASONE Take 1 tablet (50 mg total) by mouth daily with breakfast.   tiZANidine 4 MG tablet Commonly known as:  ZANAFLEX Take 1 tablet (4 mg total) by mouth every 6 (six) hours as needed for muscle spasms.   venlafaxine XR 150 MG 24 hr capsule Commonly known as:  EFFEXOR-XR Take 150 mg by mouth daily with breakfast. Take with 75 mg to equal 225 mg   venlafaxine XR 75 MG 24 hr capsule Commonly known as:  EFFEXOR-XR Take 75 mg by mouth daily with breakfast. Take with 150 mg to equal 225 mg      Follow-up Information    Coletta Memosabbell, Estela Vinal, MD Follow up in 3 week(s).   Specialty:  Neurosurgery Why:  please call to make an appointment. Contact information: [address] [place] [address] [phone]           Signed: Virginie Josten L 10/10/2017, 10:00 AM

## 2017-10-12 ENCOUNTER — Encounter (HOSPITAL_COMMUNITY): Payer: Self-pay | Admitting: Neurosurgery

## 2017-10-12 NOTE — Anesthesia Postprocedure Evaluation (Signed)
Anesthesia Post Note  Patient: Toni Thomas  Procedure(s) Performed: Left Lumbar Four-Five Microdiscectomy (Left )     Patient location during evaluation: PACU Anesthesia Type: General Level of consciousness: awake and alert Pain management: pain level controlled Vital Signs Assessment: post-procedure vital signs reviewed and stable Respiratory status: spontaneous breathing, nonlabored ventilation, respiratory function stable and patient connected to nasal cannula oxygen Cardiovascular status: blood pressure returned to baseline and stable Postop Assessment: no apparent nausea or vomiting Anesthetic complications: no    Last Vitals:  Vitals:   10/10/17 0351 10/10/17 0731  BP: 115/73 104/68  Pulse: 77 92  Resp: 18 16  Temp: 36.8 C 36.7 C  SpO2: 96% 97%    Last Pain:  Vitals:   10/10/17 0731  TempSrc: Oral  PainSc:                  Hudson Lehmkuhl S

## 2019-05-24 ENCOUNTER — Other Ambulatory Visit: Payer: Self-pay | Admitting: Physician Assistant

## 2019-05-24 DIAGNOSIS — Z1231 Encounter for screening mammogram for malignant neoplasm of breast: Secondary | ICD-10-CM

## 2019-05-27 ENCOUNTER — Ambulatory Visit: Payer: Self-pay

## 2019-08-28 ENCOUNTER — Encounter (HOSPITAL_COMMUNITY): Payer: Self-pay

## 2019-08-28 ENCOUNTER — Ambulatory Visit (HOSPITAL_COMMUNITY)
Admission: EM | Admit: 2019-08-28 | Discharge: 2019-08-28 | Disposition: A | Discharge status: Home / Self Care | Payer: Medicaid Other | Attending: Urgent Care | Admitting: Urgent Care

## 2019-08-28 DIAGNOSIS — M79644 Pain in right finger(s): Secondary | ICD-10-CM | POA: Diagnosis not present

## 2019-08-28 DIAGNOSIS — Z23 Encounter for immunization: Secondary | ICD-10-CM

## 2019-08-28 DIAGNOSIS — S61210A Laceration without foreign body of right index finger without damage to nail, initial encounter: Secondary | ICD-10-CM

## 2019-08-28 MED ORDER — TETANUS-DIPHTH-ACELL PERTUSSIS 5-2.5-18.5 LF-MCG/0.5 IM SUSP
INTRAMUSCULAR | Status: AC
  Filled 2019-08-28: qty 0.5

## 2019-08-28 MED ORDER — LIDOCAINE HCL 2 % IJ SOLN
INTRAMUSCULAR | Status: AC
  Filled 2019-08-28: qty 20

## 2019-08-28 MED ORDER — TETANUS-DIPHTH-ACELL PERTUSSIS 5-2.5-18.5 LF-MCG/0.5 IM SUSP
0.5000 mL | Freq: Once | INTRAMUSCULAR | Status: AC
  Administered 2019-08-28: 0.5 mL via INTRAMUSCULAR

## 2019-08-28 NOTE — ED Triage Notes (Signed)
Patient states she was washing dishes when she cut her finger after the class she was cleaning shattered.

## 2019-08-28 NOTE — Discharge Instructions (Signed)

## 2019-09-04 ENCOUNTER — Ambulatory Visit (HOSPITAL_COMMUNITY)
Admission: EM | Admit: 2019-09-04 | Discharge: 2019-09-04 | Disposition: A | Discharge status: Home / Self Care | Payer: Medicaid Other | Attending: Emergency Medicine | Admitting: Emergency Medicine

## 2019-09-04 ENCOUNTER — Other Ambulatory Visit: Payer: Self-pay

## 2019-09-04 ENCOUNTER — Encounter (HOSPITAL_COMMUNITY): Payer: Self-pay

## 2019-09-04 DIAGNOSIS — T148XXA Other injury of unspecified body region, initial encounter: Secondary | ICD-10-CM | POA: Diagnosis not present

## 2019-09-04 DIAGNOSIS — L089 Local infection of the skin and subcutaneous tissue, unspecified: Secondary | ICD-10-CM | POA: Diagnosis not present

## 2019-09-04 MED ORDER — CEPHALEXIN 500 MG PO CAPS: 500.0000 mg | ORAL_CAPSULE | Freq: Four times a day (QID) | ORAL | 0 refills | Status: DC

## 2019-09-04 MED ORDER — FLUCONAZOLE 150 MG PO TABS: ORAL_TABLET | ORAL | 0 refills | Status: DC

## 2019-09-04 NOTE — ED Triage Notes (Signed)
Pt present some infection of suture from her wound on her right hand/index finger. Pt finger is swollen and tight.

## 2019-09-04 NOTE — ED Provider Notes (Signed)
[facility]    CSN: [id number] Arrival date & time: 09/04/19  1654      History   Chief Complaint Chief Complaint  Patient presents with  . Wound Check    suture     HPI Toni Thomas is a 49 y.o. female.   [facility] presents with complaints of redness and drainage from laceration to right pointer finger. She had sutures placed here 8/1. A few days ago started getting pink and swelling, increased redness. Oozing, yellow discharge. Increased pain. She has been keeping it covered. No history of MRSA.  ROS per HPI, negative if not otherwise mentioned.      Past Medical History:  Diagnosis Date  . Anxiety   . Depressed   . Headache   . Hypothyroidism    not on medication    Patient Active Problem List   Diagnosis Date Noted  . HNP (herniated nucleus pulposus), lumbar 10/09/2017    Past Surgical History:  Procedure Laterality Date  . LUMBAR LAMINECTOMY/DECOMPRESSION MICRODISCECTOMY Left 10/09/2017   Procedure: Left Lumbar Four-Five Microdiscectomy;  Surgeon: Coletta Memos, MD;  Location: [facility] OR;  Service: Neurosurgery;  Laterality: Left;  Left Lumbar Four-Five Microdiscectomy  . TUBAL LIGATION      OB History   No obstetric history on file.      Home Medications    Prior to Admission medications   Medication Sig Start Date End Date Taking? Authorizing Provider  acetaminophen (TYLENOL) 325 MG tablet Take 325-975 mg by mouth every 4 (four) hours as needed for moderate pain.     [provider]  cephALEXin (KEFLEX) 500 MG capsule Take 1 capsule (500 mg total) by mouth 4 (four) times daily for 7 days. 09/04/19 09/11/19  Georgetta Haber, NP  fluconazole (DIFLUCAN) 150 MG tablet Take 1 tablet at completion of antibiotics as needed for yeast. If still with symptoms may repeat in 3 days. 09/04/19   Georgetta Haber, NP  ibuprofen (ADVIL,MOTRIN) 200 MG tablet Take 800 mg by mouth every 8 (eight) hours as needed for moderate pain.      [provider]  oxyCODONE (ROXICODONE) 5 MG immediate release tablet Take 1 tablet (5 mg total) by mouth every 6 (six) hours as needed for severe pain. 10/10/17   Coletta Memos, MD  Oxycodone HCl 10 MG TABS Take 1 tablet (10 mg total) by mouth every 4 (four) hours as needed for severe pain. Patient not taking: Reported on 10/05/2017 10/03/17   Elpidio Anis, PA-C  PERCOCET 5-325 MG tablet Take 1 tablet by mouth every 4 (four) hours as needed for severe pain. Patient not taking: Reported on 10/05/2017 10/01/17   Charlestine Night, PA-C  predniSONE (DELTASONE) 50 MG tablet Take 1 tablet (50 mg total) by mouth daily with breakfast. 10/01/17   Lawyer, Cristal Deer, PA-C  tiZANidine (ZANAFLEX) 4 MG tablet Take 1 tablet (4 mg total) by mouth every 6 (six) hours as needed for muscle spasms. 10/10/17   Coletta Memos, MD  venlafaxine XR (EFFEXOR-XR) 150 MG 24 hr capsule Take 150 mg by mouth daily with breakfast. Take with 75 mg to equal 225 mg    [provider]  venlafaxine XR (EFFEXOR-XR) 75 MG 24 hr capsule Take 75 mg by mouth daily with breakfast. Take with 150 mg to equal 225 mg    [provider]    Family History Family History  Problem Relation Age of Onset  . Atrial fibrillation Mother     Social History Social  History   Tobacco Use  . Smoking status: Never Smoker  . Smokeless tobacco: Never Used  Vaping Use  . Vaping Use: Never used  Substance Use Topics  . Alcohol use: Not Currently  . Drug use: Not Currently     Allergies   Toradol [ketorolac tromethamine]   Review of Systems Review of Systems   Physical Exam Triage Vital Signs ED Triage Vitals  Enc Vitals Group     BP 09/04/19 1750 (!) 142/94     Pulse Rate 09/04/19 1750 67     Resp 09/04/19 1750 18     Temp 09/04/19 1750 98.9 F (37.2 C)     Temp Source 09/04/19 1750 Oral     SpO2 09/04/19 1750 100 %     Weight --      Height --      Head Circumference --      Peak Flow --      Pain Score  09/04/19 1751 7     Pain Loc --      Pain Edu? --      Excl. in GC? --    No data found.  Updated Vital Signs BP (!) 142/94 (BP Location: Right Arm)   Pulse 67   Temp 98.9 F (37.2 C) (Oral)   Resp 18   LMP 08/24/2019 (Exact Date)   SpO2 100%   Visual Acuity Right Eye Distance:   Left Eye Distance:   Bilateral Distance:    Right Eye Near:   Left Eye Near:    Bilateral Near:     Physical Exam Constitutional:      General: She is not in acute distress.    Appearance: She is well-developed.  Cardiovascular:     Rate and Rhythm: Normal rate.  Pulmonary:     Effort: Pulmonary effort is normal.  Musculoskeletal:     Comments: Dorsum of proximal right hand pointer finger with simple interrupted sutures with well healing incision, which is red surrounding, tender, and with small dried yellow drainage noted ; see photo   Skin:    General: Skin is warm and dry.  Neurological:     Mental Status: She is alert and oriented to person, place, and time.        UC Treatments / Results  Labs (all labs ordered are listed, but only abnormal results are displayed) Labs Reviewed - No data to display  EKG   Radiology No results found.  Procedures Procedures (including critical care time)  Medications Ordered in UC Medications - No data to display  Initial Impression / Assessment and Plan / UC Course  I have reviewed the triage vital signs and the nursing notes.  Pertinent labs & imaging results that were available during my care of the patient were reviewed by me and considered in my medical decision making (see chart for details).     Worsening of pain, and now with redness and drainage. Antibiotics provided. Return in the next few days for removal of sutures. Return precautions provided. Patient verbalized understanding and agreeable to plan.   Final Clinical Impressions(s) / UC Diagnoses   Final diagnoses:  Infected wound     Discharge Instructions       Cleanse daily with soap and water.  Complete course of antibiotics.  Return in 2-4 days for recheck and suture removal.  Return sooner if worsening    ED Prescriptions    Medication Sig Dispense Auth. Provider   cephALEXin (KEFLEX) 500 MG capsule  Take 1 capsule (500 mg total) by mouth 4 (four) times daily for 7 days. 28 capsule Linus Mako B, NP   fluconazole (DIFLUCAN) 150 MG tablet Take 1 tablet at completion of antibiotics as needed for yeast. If still with symptoms may repeat in 3 days. 2 tablet Georgetta Haber, NP     PDMP not reviewed this encounter.   Georgetta Haber, NP 09/04/19 2237

## 2019-09-04 NOTE — Discharge Instructions (Signed)
Cleanse daily with soap and water.  Complete course of antibiotics.  Return in 2-4 days for recheck and suture removal.  Return sooner if worsening

## 2019-09-06 ENCOUNTER — Encounter (HOSPITAL_COMMUNITY): Payer: Self-pay | Admitting: Emergency Medicine

## 2019-09-06 ENCOUNTER — Ambulatory Visit: Admit: 2019-09-06 | Discharge status: Absent without leave | Payer: Medicaid Other

## 2019-09-06 ENCOUNTER — Ambulatory Visit (HOSPITAL_COMMUNITY)
Admission: EM | Admit: 2019-09-06 | Discharge: 2019-09-06 | Disposition: A | Discharge status: Home / Self Care | Payer: Medicaid Other | Attending: Urgent Care | Admitting: Urgent Care

## 2019-09-06 ENCOUNTER — Other Ambulatory Visit: Payer: Self-pay

## 2019-09-06 DIAGNOSIS — L089 Local infection of the skin and subcutaneous tissue, unspecified: Secondary | ICD-10-CM

## 2019-09-06 DIAGNOSIS — T148XXA Other injury of unspecified body region, initial encounter: Secondary | ICD-10-CM

## 2019-09-06 MED ORDER — DOXYCYCLINE HYCLATE 100 MG PO CAPS: 100.0000 mg | ORAL_CAPSULE | Freq: Two times a day (BID) | ORAL | 0 refills | Status: AC

## 2019-09-06 NOTE — ED Triage Notes (Signed)
Patient was seen 8/1 initially for sutures.  Patient returned on 8/8 due to increase in pain and drainage present.  Patient was placed on antibiotic.  Told to return Wednesday to have sutures removed unless it worsens.  Pain is worse, warm to touch, neck hurts, swollen lymph nodes.  Wound is on right index finger.  Redness around wound is spreading

## 2019-09-06 NOTE — Discharge Instructions (Signed)
Stop Keflex, start doxycycline twice daily. Use ibuprofen with food for pain and inflammation. If your symptoms persist and definitely if they worsen, then contact a hand specialist asap (no later than Friday morning) to have a consultation. Otherwise, make sure you keep your wound clean. Protect it at work. Use warm soapy soaks at home to clean.

## 2019-09-06 NOTE — ED Provider Notes (Signed)
[facility]   MRN: [id number] DOB: 1970-06-18  Subjective:   Toni Thomas is a 49 y.o. female presenting for suffering a laceration to her right index finger while cleaning the dishes.  Needs to have her Tdap updated.  Denies loss of range of motion.  No current facility-administered medications for this encounter.  Current Outpatient Medications:  .  acetaminophen (TYLENOL) 325 MG tablet, Take 325-975 mg by mouth every 4 (four) hours as needed for moderate pain. , Disp: , Rfl:  .  cephALEXin (KEFLEX) 500 MG capsule, Take 1 capsule (500 mg total) by mouth 4 (four) times daily for 7 days., Disp: 28 capsule, Rfl: 0 .  fluconazole (DIFLUCAN) 150 MG tablet, Take 1 tablet at completion of antibiotics as needed for yeast. If still with symptoms may repeat in 3 days., Disp: 2 tablet, Rfl: 0 .  ibuprofen (ADVIL,MOTRIN) 200 MG tablet, Take 800 mg by mouth every 8 (eight) hours as needed for moderate pain. , Disp: , Rfl:  .  oxyCODONE (ROXICODONE) 5 MG immediate release tablet, Take 1 tablet (5 mg total) by mouth every 6 (six) hours as needed for severe pain., Disp: 30 tablet, Rfl: 0 .  Oxycodone HCl 10 MG TABS, Take 1 tablet (10 mg total) by mouth every 4 (four) hours as needed for severe pain. (Patient not taking: Reported on 10/05/2017), Disp: 10 tablet, Rfl: 0 .  PERCOCET 5-325 MG tablet, Take 1 tablet by mouth every 4 (four) hours as needed for severe pain. (Patient not taking: Reported on 10/05/2017), Disp: 20 tablet, Rfl: 0 .  predniSONE (DELTASONE) 50 MG tablet, Take 1 tablet (50 mg total) by mouth daily with breakfast., Disp: 5 tablet, Rfl: 0 .  tiZANidine (ZANAFLEX) 4 MG tablet, Take 1 tablet (4 mg total) by mouth every 6 (six) hours as needed for muscle spasms., Disp: 60 tablet, Rfl: 0 .  venlafaxine XR (EFFEXOR-XR) 150 MG 24 hr capsule, Take 150 mg by mouth daily with breakfast. Take with 75 mg to equal 225 mg, Disp: , Rfl:  .  venlafaxine XR (EFFEXOR-XR) 75 MG 24 hr capsule,  Take 75 mg by mouth daily with breakfast. Take with 150 mg to equal 225 mg, Disp: , Rfl:    Allergies  Allergen Reactions  . Toradol [Ketorolac Tromethamine] Shortness Of Breath and Nausea And Vomiting    Chest Pain    Past Medical History:  Diagnosis Date  . Anxiety   . Depressed   . Headache   . Hypothyroidism    not on medication     Past Surgical History:  Procedure Laterality Date  . LUMBAR LAMINECTOMY/DECOMPRESSION MICRODISCECTOMY Left 10/09/2017   Procedure: Left Lumbar Four-Five Microdiscectomy;  Surgeon: Coletta Memos, MD;  Location: [facility] OR;  Service: Neurosurgery;  Laterality: Left;  Left Lumbar Four-Five Microdiscectomy  . TUBAL LIGATION      Family History  Problem Relation Age of Onset  . Atrial fibrillation Mother     Social History   Tobacco Use  . Smoking status: Never Smoker  . Smokeless tobacco: Never Used  Vaping Use  . Vaping Use: Never used  Substance Use Topics  . Alcohol use: Not Currently  . Drug use: Not Currently    ROS   Objective:   Vitals: BP (!) 156/84   Pulse 90   Temp 98.7 F (37.1 C)   Resp 14   LMP 08/24/2019 (Exact Date)   SpO2 97%   Physical Exam Constitutional:      General: She is  not in acute distress.    Appearance: Normal appearance. She is well-developed. She is not ill-appearing.  HENT:     Head: Normocephalic and atraumatic.     Nose: Nose normal.     Mouth/Throat:     Mouth: Mucous membranes are moist.     Pharynx: Oropharynx is clear.  Eyes:     General: No scleral icterus.    Extraocular Movements: Extraocular movements intact.     Pupils: Pupils are equal, round, and reactive to light.  Cardiovascular:     Rate and Rhythm: Normal rate.  Pulmonary:     Effort: Pulmonary effort is normal.  Musculoskeletal:       Hands:  Skin:    General: Skin is warm and dry.  Neurological:     General: No focal deficit present.     Mental Status: She is alert and oriented to person, place, and time.    Psychiatric:        Mood and Affect: Mood normal.        Behavior: Behavior normal.     PROCEDURE NOTE: laceration repair Verbal consent obtained from patient.  Local anesthesia with 2cc Lidocaine 2% without epinephrine.  Wound explored for tendon, ligament damage. Wound scrubbed with soap and water and rinsed. Wound closed with #4 4-0 Prolene (simple interrupted) sutures.  Wound cleansed and dressed.  Tdap updated.  Assessment and Plan :   PDMP not reviewed this encounter.  1. Finger pain, right   2. Laceration of right index finger without damage to nail, foreign body presence unspecified, initial encounter   3. Need for diphtheria-tetanus-pertussis (Tdap) vaccine     Laceration repaired successfully.  Wound care reviewed.  Use supportive care. Counseled patient on potential for adverse effects with medications prescribed/recommended today, ER and return-to-clinic precautions discussed, patient verbalized understanding.    Wallis Bamberg, [place] 09/06/19 [phone]

## 2019-11-26 IMAGING — MR MR LUMBAR SPINE W/O CM
4 of 5 series · 21 of 48 positions shown · non-contrast
Comparison: None.

CLINICAL DATA: 46 y/o F; acute on chronic lower back pain radiating
down the left leg.

EXAM:
MRI LUMBAR SPINE WITHOUT CONTRAST
TECHNIQUE: Multiplanar, multisequence MR imaging of the lumbar spine was
performed. No intravenous contrast was administered.

[Series 2: T2 · sagittal · 4.5mm · 0.59mm/px · 7 of 15 slices shown (1 of 2)]
[im 1/15]
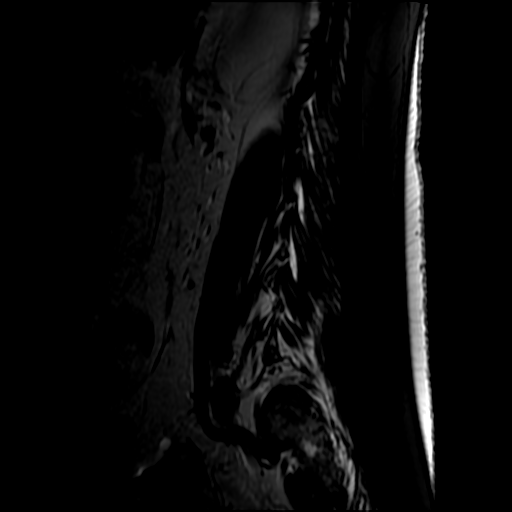
[im 3/15]
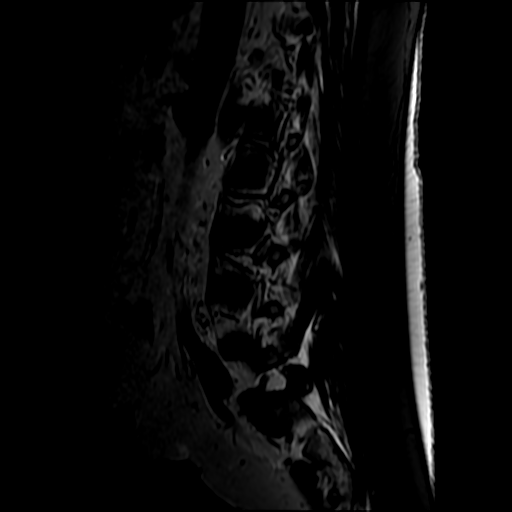
[im 5/15]
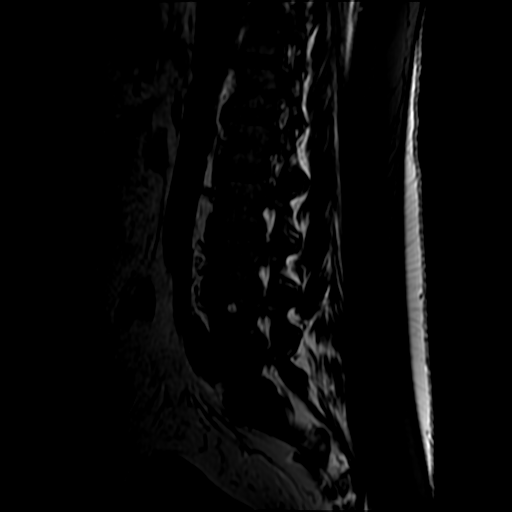
[im 8/15]
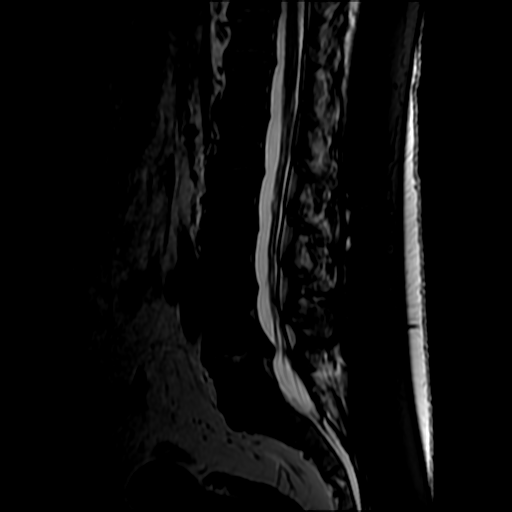
[im 10/15]
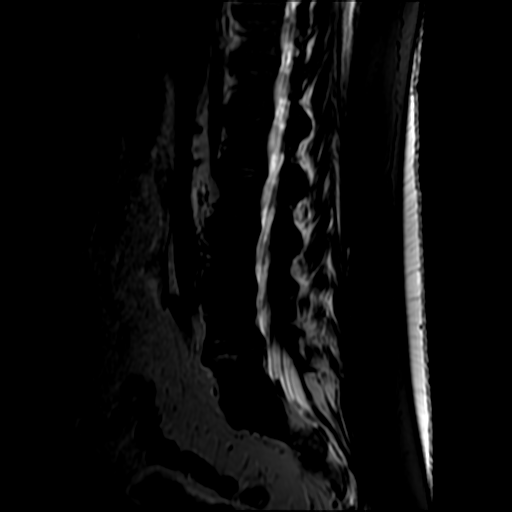
[im 12/15]
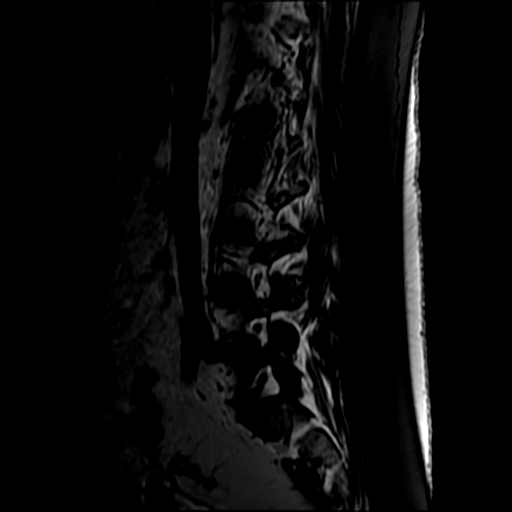
[im 15/15]
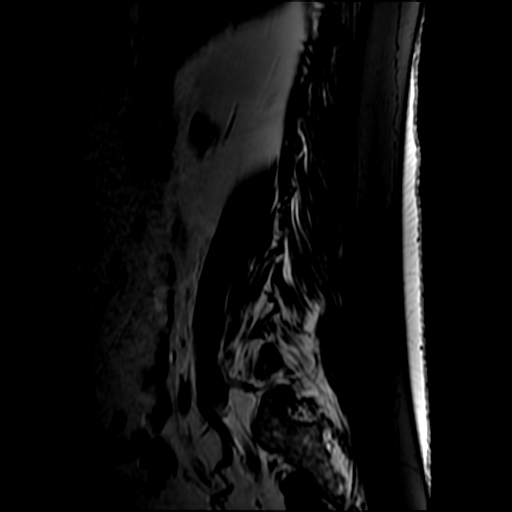

[Series 3: T1 · sagittal · 4.5mm · 0.59mm/px · 3 of 15 slices shown (1 of 2)]
[im 3/15]
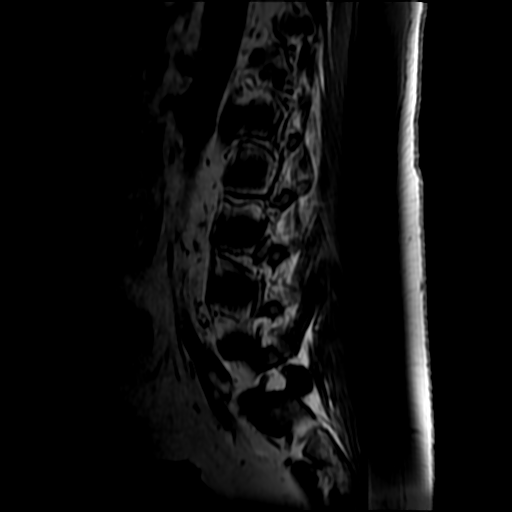
[im 8/15]
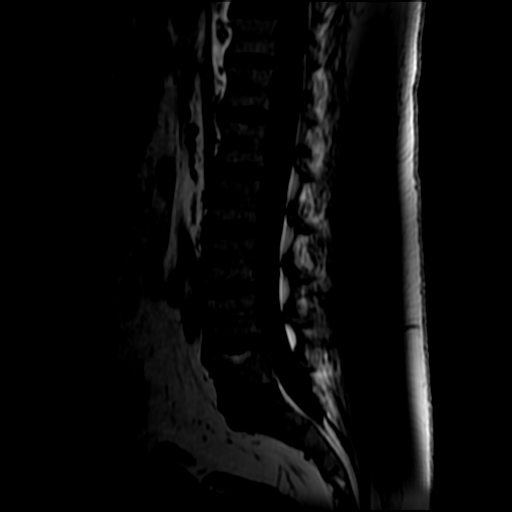
[im 12/15]
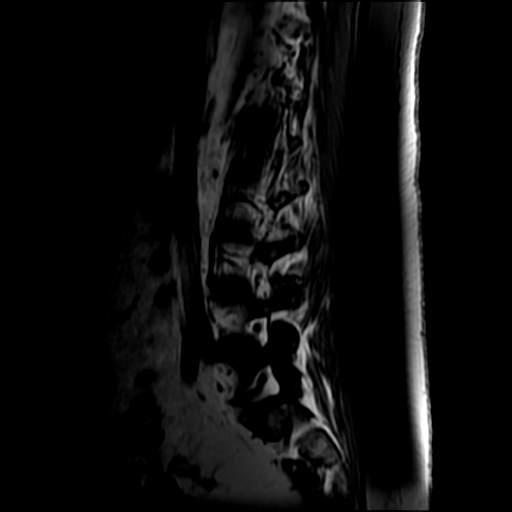

[Series 4: T2 · axial · 4.0mm · 0.43mm/px · z∈[-108,+70]mm · 8 of 33 slices shown (2 of 2)]
[im 1/33]
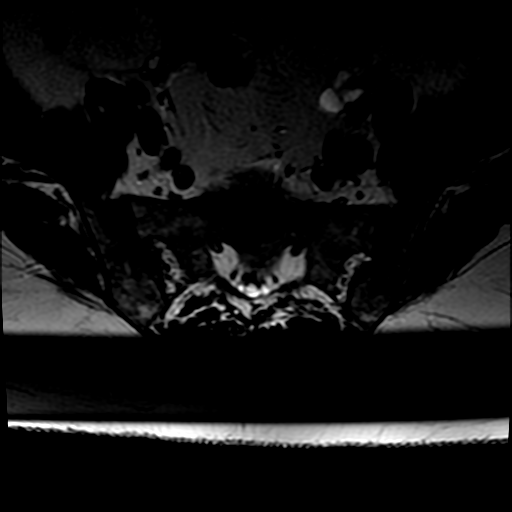
[im 5/33]
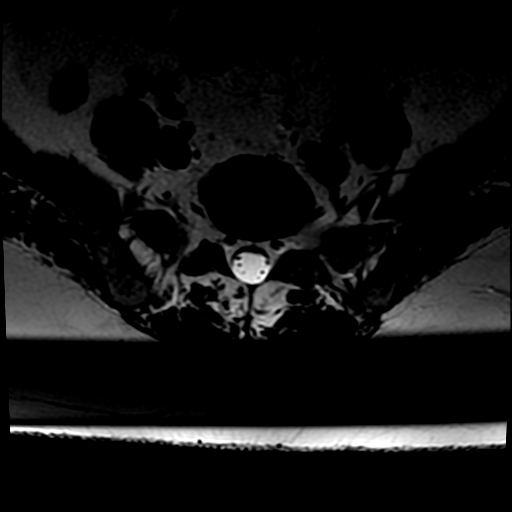
[im 10/33]
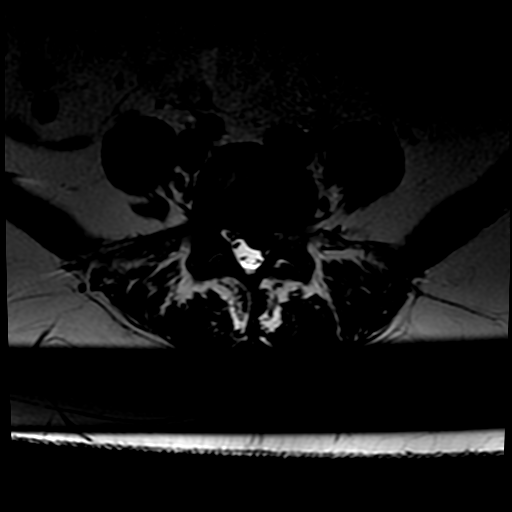
[im 15/33]
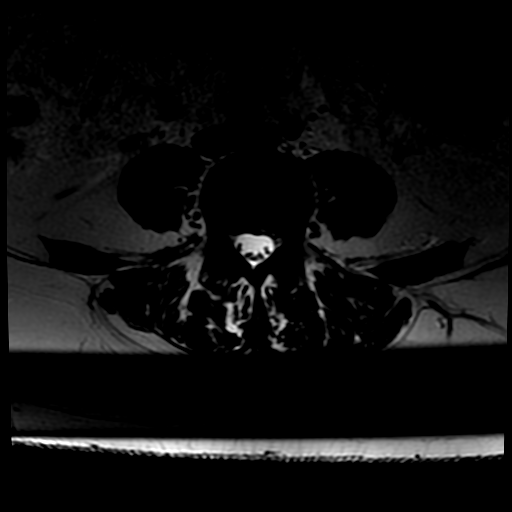
[im 18/33]
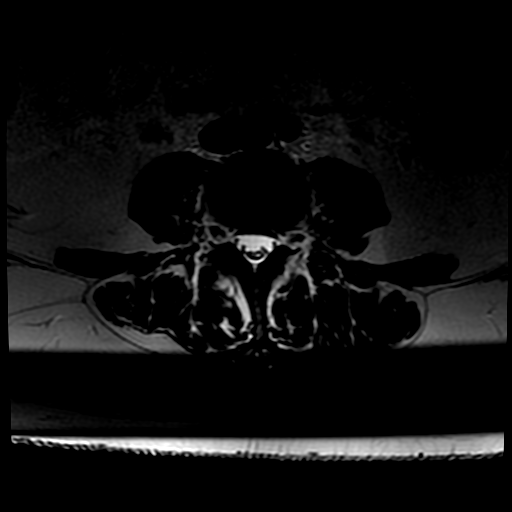
[im 23/33]
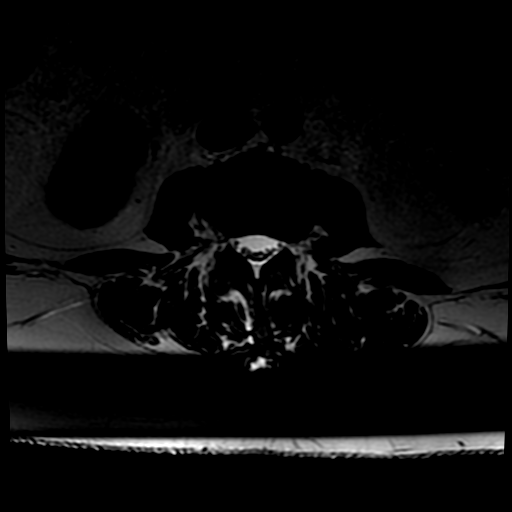
[im 28/33]
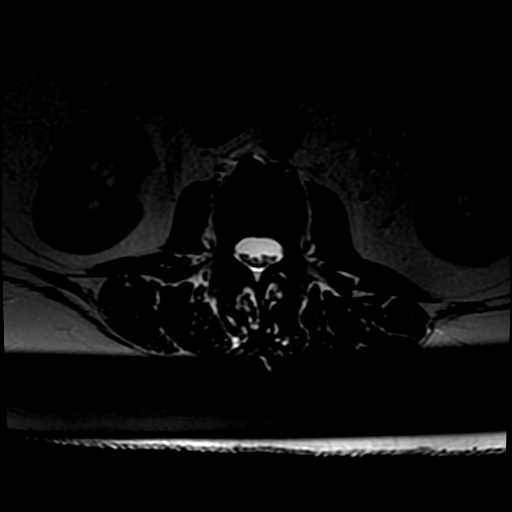
[im 33/33]
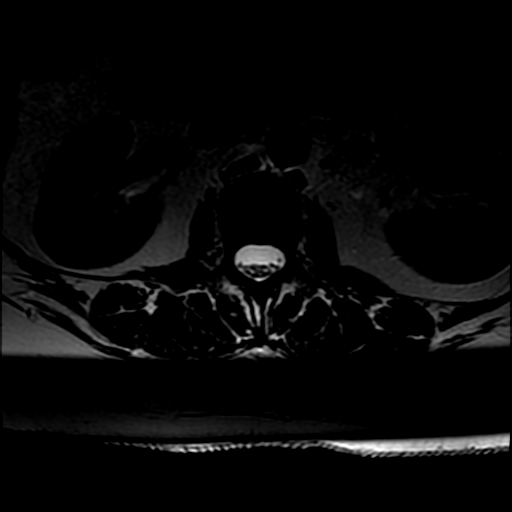

[Series 6: T1 · axial · 4.0mm · 0.86mm/px · z∈[-89,+45]mm · 3 of 33 slices shown (2 of 2)]
[im 5/33]
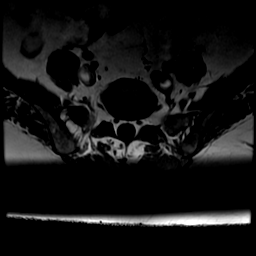
[im 18/33]
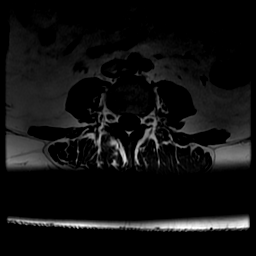
[im 28/33]
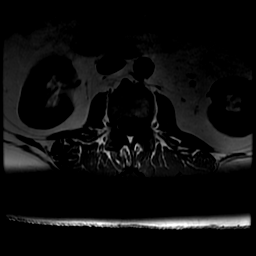

[21 of 48 positions shown; findings below may reference images not displayed]

FINDINGS: Segmentation:  Standard.

Alignment:  Physiologic.

Vertebrae:  No fracture, evidence of discitis, or bone lesion.

Conus medullaris and cauda equina: Conus extends to the T12-L1
level. Conus and cauda equina appear normal.

Paraspinal and other soft tissues: Negative.

Disc levels:

L1-2: No significant disc displacement, foraminal stenosis, or canal
stenosis.

L2-3: Small left extraforaminal disc protrusion with contact on the
exiting left L2 nerve root (series 4, image 10).

L3-4: Mild disc bulge and facet hypertrophy. No significant
foraminal or canal stenosis.

L4-5: Left subarticular disc extrusion with 11 mm inferior migration
effacing the left lateral recess and contacting the descending left
L5 and S1 nerve roots (series 4, image 24). Mild facet hypertrophy.
No significant foraminal or canal stenosis.

L5-S1: No significant disc displacement, foraminal stenosis, or
canal stenosis. Mild facet hypertrophy.
IMPRESSION: 1. No acute osseous abnormality or malalignment.
2. L2-3 small left extraforaminal disc protrusion with contact on
the exiting left L2 nerve root.
3. L4-5 left subarticular disc extrusion with inferior migration
effacing the left lateral recess and contacting the descending left
L5 and S1 nerve roots.
4. No significant foraminal or canal stenosis.

By: Niaz Holm M.D.

## 2021-05-04 ENCOUNTER — Encounter (HOSPITAL_BASED_OUTPATIENT_CLINIC_OR_DEPARTMENT_OTHER): Payer: Self-pay

## 2021-05-04 ENCOUNTER — Emergency Department (HOSPITAL_BASED_OUTPATIENT_CLINIC_OR_DEPARTMENT_OTHER)
Admission: EM | Admit: 2021-05-04 | Discharge: 2021-05-04 | Disposition: A | Discharge status: Home / Self Care | Payer: Medicaid Other | Attending: Emergency Medicine | Admitting: Emergency Medicine

## 2021-05-04 ENCOUNTER — Emergency Department (HOSPITAL_BASED_OUTPATIENT_CLINIC_OR_DEPARTMENT_OTHER): Payer: Medicaid Other

## 2021-05-04 DIAGNOSIS — E039 Hypothyroidism, unspecified: Secondary | ICD-10-CM | POA: Diagnosis not present

## 2021-05-04 DIAGNOSIS — R140 Abdominal distension (gaseous): Secondary | ICD-10-CM | POA: Insufficient documentation

## 2021-05-04 DIAGNOSIS — F17200 Nicotine dependence, unspecified, uncomplicated: Secondary | ICD-10-CM | POA: Diagnosis not present

## 2021-05-04 DIAGNOSIS — R1013 Epigastric pain: Secondary | ICD-10-CM | POA: Insufficient documentation

## 2021-05-04 DIAGNOSIS — R1011 Right upper quadrant pain: Secondary | ICD-10-CM | POA: Diagnosis not present

## 2021-05-04 DIAGNOSIS — R197 Diarrhea, unspecified: Secondary | ICD-10-CM | POA: Diagnosis not present

## 2021-05-04 DIAGNOSIS — R110 Nausea: Secondary | ICD-10-CM | POA: Diagnosis not present

## 2021-05-04 DIAGNOSIS — R1931 Right upper quadrant abdominal rigidity: Secondary | ICD-10-CM | POA: Diagnosis not present

## 2021-05-04 LAB — CBC
HCT: 43.9 % (ref 36.0–46.0)
Hemoglobin: 14.2 g/dL (ref 12.0–15.0)
MCH: 27.3 pg (ref 26.0–34.0)
MCHC: 32.3 g/dL (ref 30.0–36.0)
MCV: 84.4 fL (ref 80.0–100.0)
Platelets: 398 10*3/uL (ref 150–400)
RBC: 5.2 MIL/uL — ABNORMAL HIGH (ref 3.87–5.11)
RDW: 14.6 % (ref 11.5–15.5)
WBC: 7.5 10*3/uL (ref 4.0–10.5)
nRBC: 0 % (ref 0.0–0.2)

## 2021-05-04 LAB — COMPREHENSIVE METABOLIC PANEL
ALT: 14 U/L (ref 0–44)
AST: 17 U/L (ref 15–41)
Albumin: 3.6 g/dL (ref 3.5–5.0)
Alkaline Phosphatase: 81 U/L (ref 38–126)
Anion gap: 7 (ref 5–15)
BUN: 19 mg/dL (ref 6–20)
CO2: 25 mmol/L (ref 22–32)
Calcium: 9.2 mg/dL (ref 8.9–10.3)
Chloride: 105 mmol/L (ref 98–111)
Creatinine, Ser: 0.62 mg/dL (ref 0.44–1.00)
GFR, Estimated: >60 mL/min (ref >60)
Glucose, Bld: 89 mg/dL (ref 70–99)
Potassium: 3.8 mmol/L (ref 3.5–5.1)
Sodium: 137 mmol/L (ref 135–145)
Total Bilirubin: 0.3 mg/dL (ref 0.3–1.2)
Total Protein: 8.8 g/dL — ABNORMAL HIGH (ref 6.5–8.1)

## 2021-05-04 LAB — URINALYSIS, ROUTINE W REFLEX MICROSCOPIC
Bilirubin Urine: NEGATIVE
Glucose, UA: NEGATIVE mg/dL
Ketones, ur: NEGATIVE mg/dL
Leukocytes,Ua: NEGATIVE
Nitrite: NEGATIVE
Protein, ur: NEGATIVE mg/dL
Specific Gravity, Urine: >=1.03 (ref 1.005–1.030)
pH: 5 (ref 5.0–8.0)

## 2021-05-04 LAB — LIPASE, BLOOD: Lipase: 38 U/L (ref 11–51)

## 2021-05-04 LAB — URINALYSIS, MICROSCOPIC (REFLEX)

## 2021-05-04 LAB — PREGNANCY, URINE: Preg Test, Ur: NEGATIVE

## 2021-05-04 MED ORDER — ONDANSETRON HCL 4 MG/2ML IJ SOLN
4.0000 mg | Freq: Once | INTRAMUSCULAR | Status: AC
  Administered 2021-05-04: 4 mg via INTRAVENOUS
  Filled 2021-05-04: qty 2

## 2021-05-04 MED ORDER — LACTATED RINGERS IV BOLUS
1000.0000 mL | Freq: Once | INTRAVENOUS | Status: AC
  Administered 2021-05-04: 1000 mL via INTRAVENOUS

## 2021-05-04 MED ORDER — IOHEXOL 300 MG/ML  SOLN
100.0000 mL | Freq: Once | INTRAMUSCULAR | Status: AC | PRN
  Administered 2021-05-04: 100 mL via INTRAVENOUS

## 2021-05-04 NOTE — ED Triage Notes (Signed)
States diarrhea x 2 weeks and a "knot" in her abdomen. Also c/o right lower abdominal pain. ?

## 2021-05-04 NOTE — Discharge Instructions (Signed)
You can continue to take the medicine for the diarrhea.  All your labs today look normal except some minimal blood in your urine.  The CAT scan does not show anything new at this time but does show some thickening of your bladder.  You should follow-up with your regular doctor because if you continue to have blood in your urine they may want you to see a specialist in the future because you did use to smoke cigarettes.  However there is nothing to explain your diarrhea today.  Maybe it was a foodborne illness or a virus.  It is possible that it is related to your gallbladder and if it continues you may need to see a GI specialist or have another HIDA scan done. ?

## 2021-05-04 NOTE — ED Provider Notes (Signed)
?[facility] EMERGENCY DEPARTMENT ?Provider Note ? ? ?CSN: [id number] ?Arrival date & time: 05/04/21  1306 ? ?  ? ?History ? ?Chief Complaint  ?Patient presents with  ? Diarrhea  ? Abdominal Pain  ? ? ?Toni Thomas is a 51 y.o. female. ? ?Patient is a 52 year old female with a history of hypothyroidism, depression and prior back surgeries who is presenting today with 2 weeks of persistent diarrhea.  Patient initially reported she thought it was related to an illness that was going around work.  She reported multiple colleagues had vomiting and diarrhea however her diarrhea has been consistent usually 7-8 episodes of watery stool daily for the last 2 weeks.  She has gradually had worsening abdominal pain as well which she describes as throughout the abdomen but does seem worse on the right and in the last 2 to 3 days she has had distention and discomfort in the epigastric area as well.  She has had some intermittent nausea which is gradually worsening but denies any vomiting.  When she eats it definitely makes the diarrhea worse.  She has tried antidiarrheals with minimal improvement.  She has had no recent travel, antibiotics or prior history of recurrent diarrhea.  She does take Effexor but no other medications that would cause diarrhea.  She is not recently discontinued any medications.  She does report years ago when she was in [place] she was having pain and diarrhea and had a HIDA scan at that time that showed abnormal gallbladder function but never had surgery and symptoms seem to resolve on their own.  She has not had any fever, urinary symptoms, cough or congestion. ? ?The history is provided by the patient.  ?Diarrhea ?Associated symptoms: abdominal pain   ?Abdominal Pain ?Associated symptoms: diarrhea   ? ?  ? ?Home Medications ?Prior to Admission medications   ?Medication Sig Start Date End Date Taking? Authorizing Provider  ?acetaminophen (TYLENOL) 325 MG tablet Take 325-975 mg by mouth  every 4 (four) hours as needed for moderate pain.     [provider]  ?doxycycline (VIBRAMYCIN) 100 MG capsule Take 1 capsule (100 mg total) by mouth 2 (two) times daily. 09/06/19   Wallis Bamberg, PA-C  ?ibuprofen (ADVIL,MOTRIN) 200 MG tablet Take 800 mg by mouth every 8 (eight) hours as needed for moderate pain.     [provider]  ?oxyCODONE (ROXICODONE) 5 MG immediate release tablet Take 1 tablet (5 mg total) by mouth every 6 (six) hours as needed for severe pain. 10/10/17   Coletta Memos, MD  ?Oxycodone HCl 10 MG TABS Take 1 tablet (10 mg total) by mouth every 4 (four) hours as needed for severe pain. ?Patient not taking: Reported on 10/05/2017 10/03/17   Elpidio Anis, PA-C  ?PERCOCET 5-325 MG tablet Take 1 tablet by mouth every 4 (four) hours as needed for severe pain. ?Patient not taking: Reported on 10/05/2017 10/01/17   Charlestine Night, PA-C  ?venlafaxine XR (EFFEXOR-XR) 150 MG 24 hr capsule Take 150 mg by mouth daily with breakfast. Take with 75 mg to equal 225 mg    [provider]  ?venlafaxine XR (EFFEXOR-XR) 75 MG 24 hr capsule Take 75 mg by mouth daily with breakfast. Take with 150 mg to equal 225 mg    [provider]  ?   ? ?Allergies    ?Miconazole nitrate and Toradol [ketorolac tromethamine]   ? ?Review of Systems   ?Review of Systems  ?Gastrointestinal:  Positive for abdominal pain and diarrhea.  ? ?  Physical Exam ?Updated Vital Signs ?BP (!) 130/92   Pulse 84   Temp 98 ?F (36.7 ?C) (Oral)   Resp 18   Ht 5\' 8"  (1.727 m)   Wt 106.6 kg   LMP 04/28/2021 (Approximate)   SpO2 98%   BMI 35.73 kg/m?  ?Physical Exam ?Vitals and nursing note reviewed.  ?Constitutional:   ?   General: She is not in acute distress. ?   Appearance: She is well-developed.  ?HENT:  ?   Head: Normocephalic and atraumatic.  ?   Mouth/Throat:  ?   Mouth: Mucous membranes are moist.  ?Eyes:  ?   Pupils: Pupils are equal, round, and reactive to light.  ?Cardiovascular:  ?   Rate and Rhythm:  Normal rate and regular rhythm.  ?   Heart sounds: Normal heart sounds. No murmur heard. ?  No friction rub.  ?Pulmonary:  ?   Effort: Pulmonary effort is normal.  ?   Breath sounds: Normal breath sounds. No wheezing or rales.  ?Abdominal:  ?   General: Bowel sounds are normal. There is no distension.  ?   Palpations: Abdomen is soft.  ?   Tenderness: There is abdominal tenderness in the right upper quadrant. There is right CVA tenderness and guarding. There is no rebound.  ?Musculoskeletal:     ?   General: No tenderness. Normal range of motion.  ?   Cervical back: Normal range of motion and neck supple.  ?   Right lower leg: No edema.  ?   Left lower leg: No edema.  ?   Comments: No edema  ?Skin: ?   General: Skin is warm and dry.  ?   Findings: No rash.  ?Neurological:  ?   Mental Status: She is alert and oriented to person, place, and time. Mental status is at baseline.  ?   Cranial Nerves: No cranial nerve deficit.  ?Psychiatric:     ?   Mood and Affect: Mood normal.     ?   Behavior: Behavior normal.  ? ? ?ED Results / Procedures / Treatments   ?Labs ?(all labs ordered are listed, but only abnormal results are displayed) ?Labs Reviewed  ?COMPREHENSIVE METABOLIC PANEL - Abnormal; Notable for the following components:  ?    Result Value  ? Total Protein 8.8 (*)   ? All other components within normal limits  ?CBC - Abnormal; Notable for the following components:  ? RBC 5.20 (*)   ? All other components within normal limits  ?URINALYSIS, ROUTINE W REFLEX MICROSCOPIC - Abnormal; Notable for the following components:  ? Hgb urine dipstick TRACE (*)   ? All other components within normal limits  ?URINALYSIS, MICROSCOPIC (REFLEX) - Abnormal; Notable for the following components:  ? Bacteria, UA FEW (*)   ? All other components within normal limits  ?GASTROINTESTINAL PANEL BY PCR, STOOL (REPLACES STOOL CULTURE)  ?C DIFFICILE QUICK SCREEN W PCR REFLEX    ?LIPASE, BLOOD  ?PREGNANCY, URINE  ? ? ?EKG ?None ? ?Radiology ?CT  ABDOMEN PELVIS W CONTRAST ? ?Result Date: 05/04/2021 ?CLINICAL DATA:  Abdominal pain, acute, nonlocalized EXAM: CT ABDOMEN AND PELVIS WITH CONTRAST TECHNIQUE: Multidetector CT imaging of the abdomen and pelvis was performed using the standard protocol following bolus administration of intravenous contrast. RADIATION DOSE REDUCTION: This exam was performed according to the departmental dose-optimization program which includes automated exposure control, adjustment of the mA and/or kV according to patient size and/or use of iterative reconstruction technique. CONTRAST:  100mL OMNIPAQUE IOHEXOL 300 MG/ML  SOLN COMPARISON:  None. FINDINGS: Lower chest: No acute abnormality. Hepatobiliary: Incomplete assessment the superior hepatic dome. Focal fatty deposition adjacent to the falciform ligament. Gallbladder is unremarkable. Portal vein is patent. No intrahepatic or extrahepatic biliary ductal dilation. Pancreas: Unremarkable. No pancreatic ductal dilatation or surrounding inflammatory changes. Spleen: Normal in size without focal abnormality. Adrenals/Urinary Tract: Adrenal glands are unremarkable. No hydronephrosis. Kidneys enhance symmetrically. No obstructing nephrolithiasis. Mild circumferential bladder wall thickening with submucosal fat deposition. Stomach/Bowel: No evidence of bowel obstruction. Scattered diverticulosis without evidence of acute diverticulitis. Appendix is normal. Small hiatal hernia. Vascular/Lymphatic: Aorta is normal in course and caliber. Nonspecific misty mesentery. No pathologically enlarged lymph nodes are identified. Reproductive: Status post bilateral tubal ligation. Uterus is present. Other: No free air or free fluid. Musculoskeletal: Degenerative changes of the lower lumbar spine. RIGHT-sided assimilation joint at L5-S1. IMPRESSION: 1. Mild circumferential bladder wall thickening with submucosal fatty deposition. Findings could reflect a degree of chronic cystitis. Recommend correlation  with urine analysis. 2. Normal appendix. Electronically Signed   By: Meda KlinefelterStephanie  Peacock M.D.   On: 05/04/2021 15:26   ? ?Procedures ?Procedures  ? ? ?Medications Ordered in ED ?Medications  ?lactated ringers bolus 1

## 2022-09-09 ENCOUNTER — Ambulatory Visit (HOSPITAL_BASED_OUTPATIENT_CLINIC_OR_DEPARTMENT_OTHER)
Admission: RE | Admit: 2022-09-09 | Discharge: 2022-09-09 | Disposition: A | Discharge status: Home / Self Care | Payer: Medicaid Other | Source: Ambulatory Visit | Attending: Family Medicine | Admitting: Family Medicine

## 2022-09-09 ENCOUNTER — Other Ambulatory Visit (HOSPITAL_BASED_OUTPATIENT_CLINIC_OR_DEPARTMENT_OTHER): Payer: Self-pay | Admitting: Family Medicine

## 2022-09-09 DIAGNOSIS — R600 Localized edema: Secondary | ICD-10-CM

## 2023-06-30 IMAGING — CT CT ABD-PELV W/ CM
2 of 6 series · 16 of 46 positions shown, 18 images · IV contrast (Omnipaque)
Comparison: None.

CLINICAL DATA: Abdominal pain, acute, nonlocalized

EXAM:
CT ABDOMEN AND PELVIS WITH CONTRAST
TECHNIQUE: Multidetector CT imaging of the abdomen and pelvis was performed
using the standard protocol following bolus administration of
intravenous contrast.

[Series 2: axial st · axial · 0.91mm/px · z∈[-530,-120]mm · 13 of 96 slices shown, 15 images]
[im 7/96  soft-tissue]
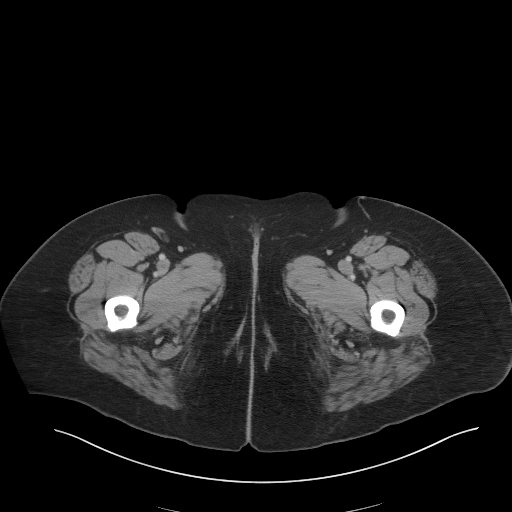
[im 7/96  bone]
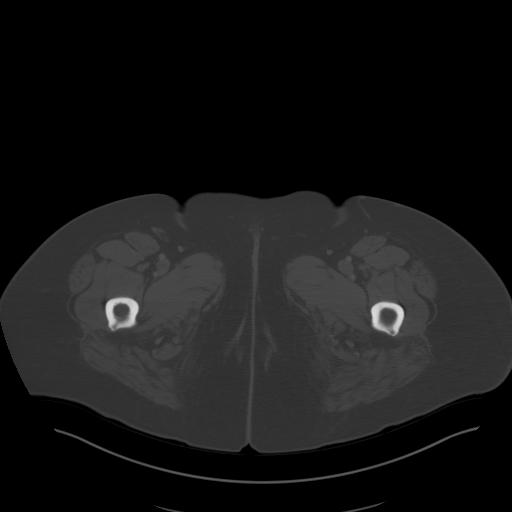
[im 14/96  soft-tissue]
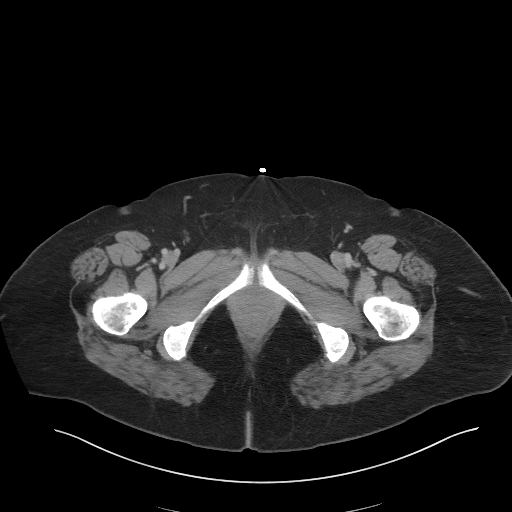
[im 21/96  soft-tissue]
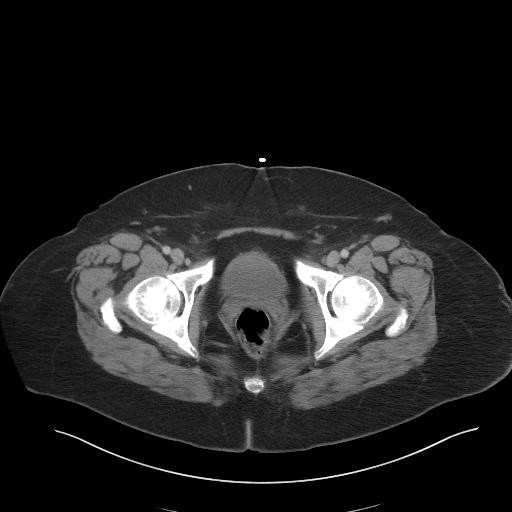
[im 28/96  soft-tissue]
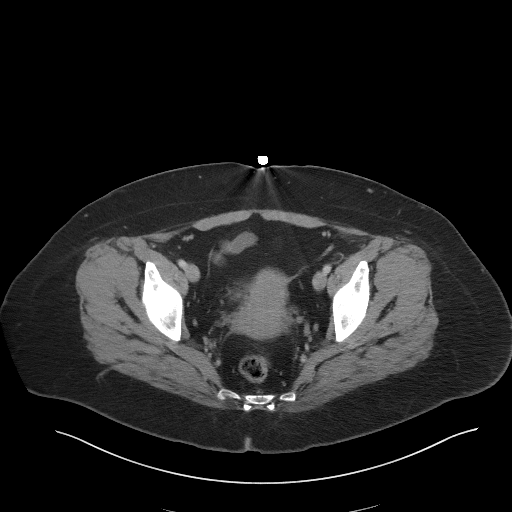
[im 34/96  soft-tissue]
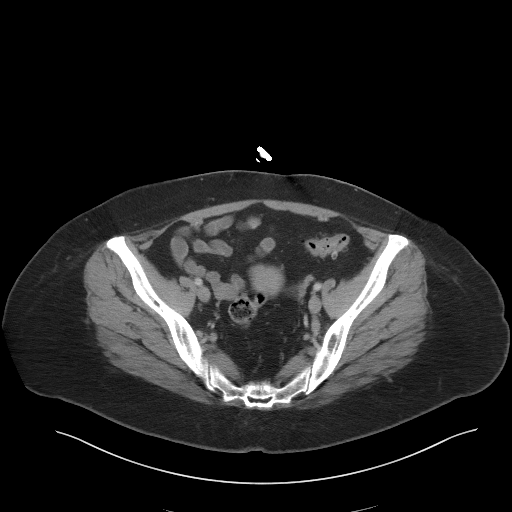
[im 41/96  soft-tissue]
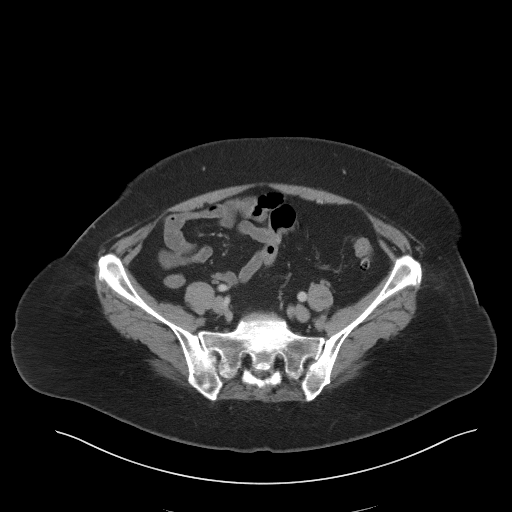
[im 48/96  soft-tissue]
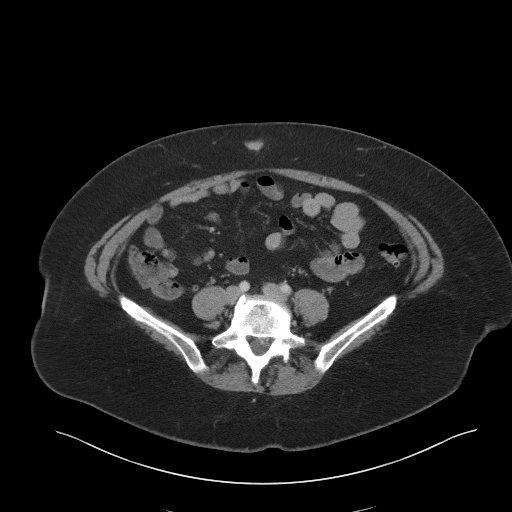
[im 55/96  soft-tissue]
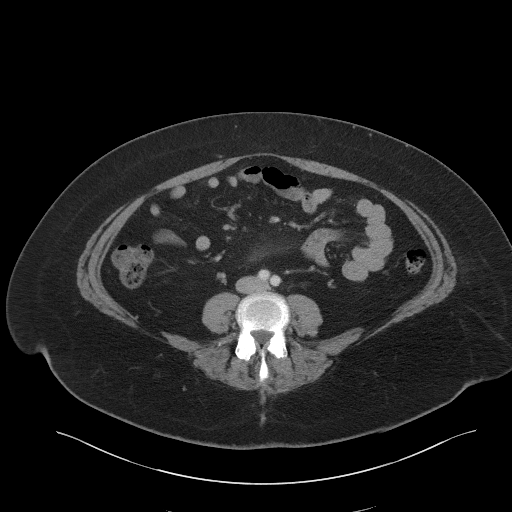
[im 62/96  soft-tissue]
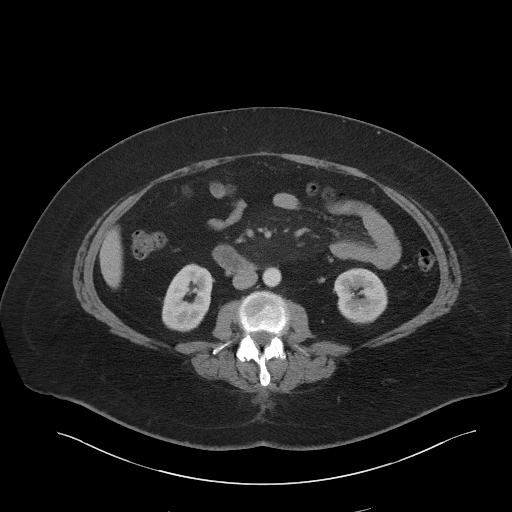
[im 62/96  bone]
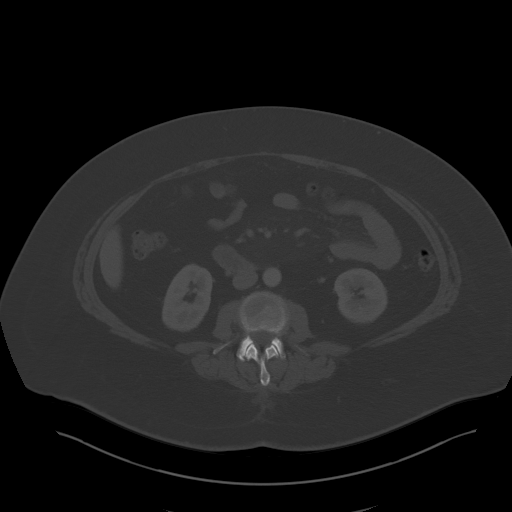
[im 68/96  soft-tissue]
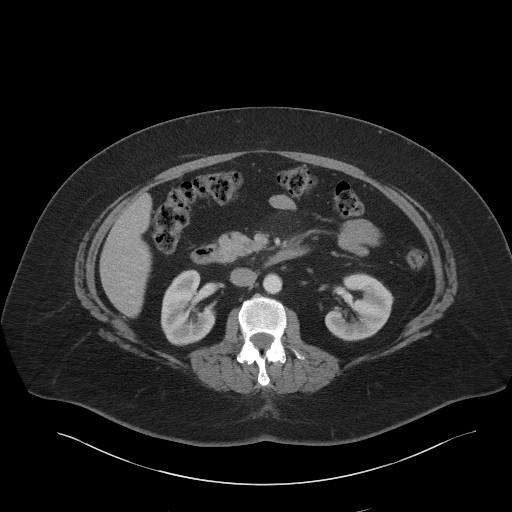
[im 75/96  soft-tissue]
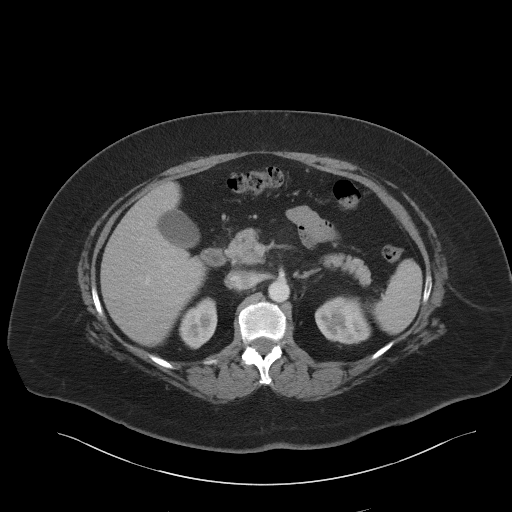
[im 82/96  soft-tissue]
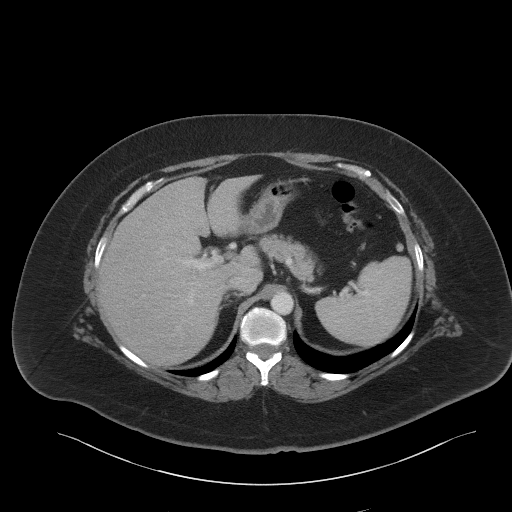
[im 89/96  soft-tissue]
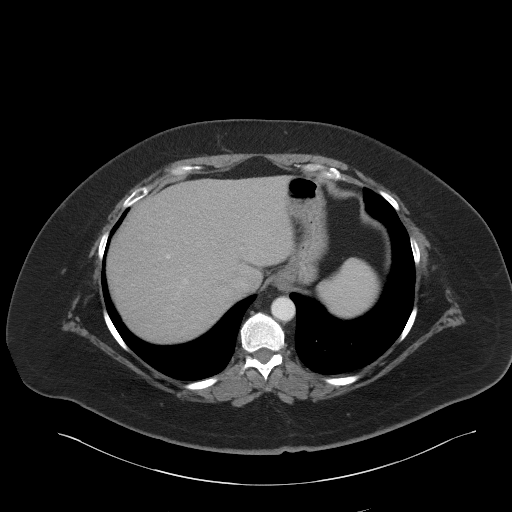

[Series 6: coronal st · coronal · 0.88mm/px · 3 of 104 slices shown]
[im 35/104  soft-tissue]
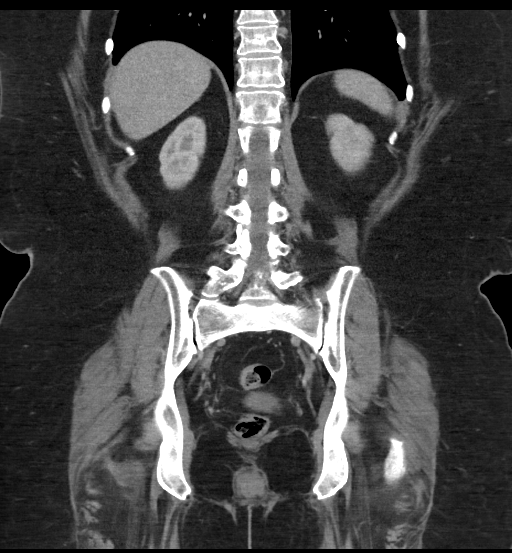
[im 46/104  soft-tissue]
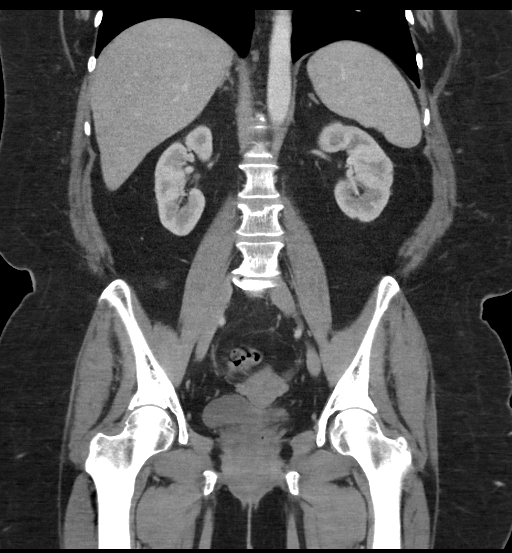
[im 58/104  soft-tissue]
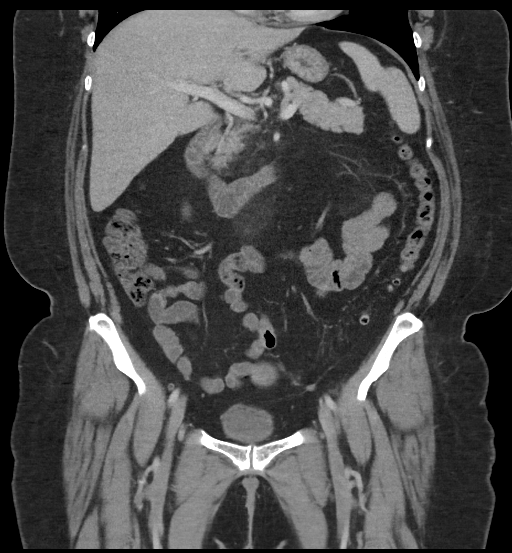

[16 of 46 positions shown; findings below may reference images not displayed]

RADIATION DOSE REDUCTION: This exam was performed according to the
departmental dose-optimization program which includes automated
exposure control, adjustment of the mA and/or kV according to
patient size and/or use of iterative reconstruction technique.

CONTRAST:  100mL OMNIPAQUE IOHEXOL 300 MG/ML  SOLN
FINDINGS: Lower chest: No acute abnormality.

Hepatobiliary: Incomplete assessment the superior hepatic dome.
Focal fatty deposition adjacent to the falciform ligament.
Gallbladder is unremarkable. Portal vein is patent. No intrahepatic
or extrahepatic biliary ductal dilation.

Pancreas: Unremarkable. No pancreatic ductal dilatation or
surrounding inflammatory changes.

Spleen: Normal in size without focal abnormality.

Adrenals/Urinary Tract: Adrenal glands are unremarkable. No
hydronephrosis. Kidneys enhance symmetrically. No obstructing
nephrolithiasis. Mild circumferential bladder wall thickening with
submucosal fat deposition.

Stomach/Bowel: No evidence of bowel obstruction. Scattered
diverticulosis without evidence of acute diverticulitis. Appendix is
normal. Small hiatal hernia.

Vascular/Lymphatic: Aorta is normal in course and caliber.
Nonspecific misty mesentery. No pathologically enlarged lymph nodes
are identified.

Reproductive: Status post bilateral tubal ligation. Uterus is
present.

Other: No free air or free fluid.

Musculoskeletal: Degenerative changes of the lower lumbar spine.
RIGHT-sided assimilation joint at L5-S1.
IMPRESSION: 1. Mild circumferential bladder wall thickening with submucosal
fatty deposition. Findings could reflect a degree of chronic
cystitis. Recommend correlation with urine analysis.
2. Normal appendix.
